# Patient Record
Sex: Female | Born: 1960 | Race: White | Hispanic: No | Smoking: Never smoker
Health system: Southern US, Community
[De-identification: ages and names within clinical notes are randomized; demographics above are authoritative.]

## PROBLEM LIST (undated history)

## (undated) DIAGNOSIS — E039 Hypothyroidism, unspecified: Secondary | ICD-10-CM

## (undated) DIAGNOSIS — N951 Menopausal and female climacteric states: Secondary | ICD-10-CM

## (undated) DIAGNOSIS — E669 Obesity, unspecified: Secondary | ICD-10-CM

---

## 1997-09-17 ENCOUNTER — Ambulatory Visit (HOSPITAL_COMMUNITY): Admission: RE | Admit: 1997-09-17 | Discharge: 1997-09-17 | Payer: Self-pay | Admitting: Obstetrics & Gynecology

## 1997-11-30 ENCOUNTER — Encounter: Admission: RE | Admit: 1997-11-30 | Discharge: 1998-02-28 | Payer: Self-pay | Admitting: *Deleted

## 1997-12-08 ENCOUNTER — Encounter: Payer: Self-pay | Admitting: Obstetrics and Gynecology

## 1997-12-08 ENCOUNTER — Inpatient Hospital Stay (HOSPITAL_COMMUNITY): Admission: AD | Admit: 1997-12-08 | Discharge: 1997-12-11 | Payer: Self-pay | Admitting: Obstetrics and Gynecology

## 1997-12-11 ENCOUNTER — Encounter (HOSPITAL_COMMUNITY): Admission: RE | Admit: 1997-12-11 | Discharge: 1998-03-11 | Payer: Self-pay | Admitting: Obstetrics and Gynecology

## 1998-03-14 ENCOUNTER — Encounter (HOSPITAL_COMMUNITY): Admission: RE | Admit: 1998-03-14 | Discharge: 1998-06-12 | Payer: Self-pay | Admitting: *Deleted

## 1999-05-30 ENCOUNTER — Ambulatory Visit (HOSPITAL_COMMUNITY): Admission: RE | Admit: 1999-05-30 | Discharge: 1999-05-30 | Payer: Self-pay | Admitting: *Deleted

## 1999-05-30 ENCOUNTER — Encounter: Payer: Self-pay | Admitting: *Deleted

## 2018-08-17 ENCOUNTER — Encounter (HOSPITAL_COMMUNITY): Payer: Self-pay

## 2018-08-17 ENCOUNTER — Emergency Department (HOSPITAL_COMMUNITY): Payer: PRIVATE HEALTH INSURANCE

## 2018-08-17 ENCOUNTER — Inpatient Hospital Stay (HOSPITAL_COMMUNITY)
Admission: EM | Admit: 2018-08-17 | Discharge: 2018-08-21 | DRG: 811 | Disposition: A | Discharge status: Home / Self Care | Payer: PRIVATE HEALTH INSURANCE | Attending: Internal Medicine | Admitting: Internal Medicine

## 2018-08-17 ENCOUNTER — Other Ambulatory Visit: Payer: Self-pay

## 2018-08-17 DIAGNOSIS — K5900 Constipation, unspecified: Secondary | ICD-10-CM | POA: Diagnosis present

## 2018-08-17 DIAGNOSIS — R0602 Shortness of breath: Secondary | ICD-10-CM | POA: Diagnosis not present

## 2018-08-17 DIAGNOSIS — I272 Pulmonary hypertension, unspecified: Secondary | ICD-10-CM | POA: Diagnosis present

## 2018-08-17 DIAGNOSIS — E669 Obesity, unspecified: Secondary | ICD-10-CM | POA: Diagnosis present

## 2018-08-17 DIAGNOSIS — D62 Acute posthemorrhagic anemia: Principal | ICD-10-CM | POA: Diagnosis present

## 2018-08-17 DIAGNOSIS — E282 Polycystic ovarian syndrome: Secondary | ICD-10-CM | POA: Diagnosis present

## 2018-08-17 DIAGNOSIS — Z79890 Hormone replacement therapy (postmenopausal): Secondary | ICD-10-CM

## 2018-08-17 DIAGNOSIS — E8770 Fluid overload, unspecified: Secondary | ICD-10-CM | POA: Diagnosis present

## 2018-08-17 DIAGNOSIS — T502X5A Adverse effect of carbonic-anhydrase inhibitors, benzothiadiazides and other diuretics, initial encounter: Secondary | ICD-10-CM | POA: Diagnosis not present

## 2018-08-17 DIAGNOSIS — Z87891 Personal history of nicotine dependence: Secondary | ICD-10-CM

## 2018-08-17 DIAGNOSIS — Z6835 Body mass index (BMI) 35.0-35.9, adult: Secondary | ICD-10-CM

## 2018-08-17 DIAGNOSIS — D259 Leiomyoma of uterus, unspecified: Secondary | ICD-10-CM | POA: Diagnosis present

## 2018-08-17 DIAGNOSIS — I5031 Acute diastolic (congestive) heart failure: Secondary | ICD-10-CM

## 2018-08-17 DIAGNOSIS — N920 Excessive and frequent menstruation with regular cycle: Secondary | ICD-10-CM

## 2018-08-17 DIAGNOSIS — E039 Hypothyroidism, unspecified: Secondary | ICD-10-CM | POA: Diagnosis present

## 2018-08-17 DIAGNOSIS — Z20828 Contact with and (suspected) exposure to other viral communicable diseases: Secondary | ICD-10-CM | POA: Diagnosis present

## 2018-08-17 DIAGNOSIS — D649 Anemia, unspecified: Secondary | ICD-10-CM | POA: Diagnosis present

## 2018-08-17 DIAGNOSIS — I313 Pericardial effusion (noninflammatory): Secondary | ICD-10-CM | POA: Diagnosis present

## 2018-08-17 DIAGNOSIS — E876 Hypokalemia: Secondary | ICD-10-CM | POA: Diagnosis present

## 2018-08-17 DIAGNOSIS — R7989 Other specified abnormal findings of blood chemistry: Secondary | ICD-10-CM | POA: Diagnosis present

## 2018-08-17 DIAGNOSIS — I509 Heart failure, unspecified: Secondary | ICD-10-CM

## 2018-08-17 DIAGNOSIS — E611 Iron deficiency: Secondary | ICD-10-CM | POA: Diagnosis present

## 2018-08-17 DIAGNOSIS — Z8249 Family history of ischemic heart disease and other diseases of the circulatory system: Secondary | ICD-10-CM

## 2018-08-17 HISTORY — DX: Obesity, unspecified: E66.9

## 2018-08-17 HISTORY — DX: Hypothyroidism, unspecified: E03.9

## 2018-08-17 HISTORY — DX: Menopausal and female climacteric states: N95.1

## 2018-08-17 LAB — BASIC METABOLIC PANEL
Anion gap: 10 (ref 5–15)
BUN: 10 mg/dL (ref 6–20)
CO2: 17 mmol/L — ABNORMAL LOW (ref 22–32)
Calcium: 8.6 mg/dL — ABNORMAL LOW (ref 8.9–10.3)
Chloride: 106 mmol/L (ref 98–111)
Creatinine, Ser: 0.76 mg/dL (ref 0.44–1.00)
GFR calc Af Amer: >60 mL/min (ref >60)
GFR calc non Af Amer: >60 mL/min (ref >60)
Glucose, Bld: 107 mg/dL — ABNORMAL HIGH (ref 70–99)
Potassium: 4.1 mmol/L (ref 3.5–5.1)
Sodium: 133 mmol/L — ABNORMAL LOW (ref 135–145)

## 2018-08-17 LAB — BRAIN NATRIURETIC PEPTIDE: B Natriuretic Peptide: 451 pg/mL — ABNORMAL HIGH (ref 0.0–100.0)

## 2018-08-17 LAB — TROPONIN I: Troponin I: <0.03 ng/mL (ref <0.03)

## 2018-08-17 LAB — D-DIMER, QUANTITATIVE: D-Dimer, Quant: 2.31 ug/mL-FEU — ABNORMAL HIGH (ref 0.00–0.50)

## 2018-08-17 MED ORDER — SODIUM CHLORIDE 0.9% FLUSH
3.0000 mL | Freq: Once | INTRAVENOUS | Status: AC
  Administered 2018-08-18: 3 mL via INTRAVENOUS

## 2018-08-17 MED ORDER — SODIUM CHLORIDE (PF) 0.9 % IJ SOLN
INTRAMUSCULAR | Status: AC
  Administered 2018-08-18: 3 mL via INTRAVENOUS
  Filled 2018-08-17: qty 50

## 2018-08-17 MED ORDER — IOHEXOL 350 MG/ML SOLN
100.0000 mL | Freq: Once | INTRAVENOUS | Status: AC | PRN
  Administered 2018-08-17: 100 mL via INTRAVENOUS

## 2018-08-17 NOTE — ED Triage Notes (Signed)
Pt states she is perimenopausal and has been weak from that. Pt spent a lot of time in bed from that. Pt states that she has continued to be short of breath, increasing with exertion. Pt has noticed some leg edema and abdomen as well. +1 noted up legs. No hx of CHF. Pt has not been outside of house since February.

## 2018-08-17 NOTE — ED Provider Notes (Signed)
[facility] DEPT Provider Note   CSN: [id number] Arrival date & time: 08/17/18  1555    History   Chief Complaint Chief Complaint  Patient presents with  . Shortness of Breath    HPI Gwendolyn Mclean is a 58 y.o. female to emergency department today with chief complaint of shortness of breath x1 week.  Patient states 4 months ago she had heavy vaginal bleeding and thought she was perimenopausal.  During that time she spent most of her time laying in bed.  She states 2 months ago the bleeding improved and she had started to become a little more active. She has not had any vaginal bleeding in 1 month.  Over the last week she has noticed increasing shortness of breath with exertion.  She is unable to walk up the stairs in her house without having to stop and take a break, she estimates about 5 or 6 steps.  She has also noticed swelling in her bilateral lower extremities.  Admits to associated productive cough with thick clear sputum.  Denies history of CHF. Also denies fever, chills, chest pain, abdominal pain, nausea, vomiting, urinary symptoms, diarrhea.    History reviewed. No pertinent past medical history.  Patient Active Problem List   Diagnosis Date Noted  . Symptomatic anemia 08/18/2018  . Hypothyroidism 08/18/2018    History reviewed. No pertinent surgical history.   OB History   No obstetric history on file.      Home Medications    Prior to Admission medications   Medication Sig Start Date End Date Taking? Authorizing Provider  levothyroxine (SYNTHROID) 100 MCG tablet Take 100 mcg by mouth daily before breakfast.   Yes [provider]    Family History Family History  Problem Relation Age of Onset  . Hypertension Mother   . CAD Mother   . Diabetes Neg Hx   . Cancer Neg Hx     Social History Social History   Tobacco Use  . Smoking status: Never Smoker  . Smokeless tobacco: Never Used  Substance Use Topics  . Alcohol  use: Never    Frequency: Never  . Drug use: Never     Allergies   Patient has no known allergies.   Review of Systems Review of Systems  Constitutional: Negative for chills and fever.  HENT: Negative for congestion, ear discharge, ear pain, sinus pressure, sinus pain and sore throat.   Eyes: Negative for pain and redness.  Respiratory: Positive for cough and shortness of breath.   Cardiovascular: Positive for leg swelling. Negative for chest pain.  Gastrointestinal: Negative for abdominal pain, constipation, diarrhea, nausea and vomiting.  Genitourinary: Negative for dysuria and hematuria.  Musculoskeletal: Negative for back pain and neck pain.  Skin: Negative for wound.  Neurological: Negative for weakness, numbness and headaches.     Physical Exam Updated Vital Signs BP (!) 113/50 (BP Location: Left Arm)   Pulse 99   Temp 98.5 F (36.9 C) (Oral)   Resp (!) 34   Ht 5\' 3"  (1.6 m)   Wt 77.1 kg   SpO2 100%   BMI 30.11 kg/m   Physical Exam Vitals signs and nursing note reviewed.  Constitutional:      General: She is not in acute distress.    Appearance: She is not ill-appearing.  HENT:     Head: Normocephalic and atraumatic.     Right Ear: Tympanic membrane and external ear normal.     Left Ear: Tympanic membrane and external  ear normal.     Nose: Nose normal.     Mouth/Throat:     Mouth: Mucous membranes are dry.     Pharynx: Oropharynx is clear.  Eyes:     General: No scleral icterus.       Right eye: No discharge.        Left eye: No discharge.     Extraocular Movements: Extraocular movements intact.     Conjunctiva/sclera: Conjunctivae normal.     Pupils: Pupils are equal, round, and reactive to light.     Comments: Bilateral conjunctiva pale  Neck:     Musculoskeletal: Normal range of motion.     Vascular: No JVD.  Cardiovascular:     Rate and Rhythm: Regular rhythm. Tachycardia present.     Pulses: Normal pulses.          Radial pulses are 2+ on the  right side and 2+ on the left side.     Heart sounds: Normal heart sounds.  Pulmonary:     Comments: Lungs clear to auscultation in all fields. Symmetric chest rise. No wheezing, rales, or rhonchi. Abdominal:     Comments: Abdomen is soft, non-distended, and non-tender in all quadrants. No rigidity, no guarding. No peritoneal signs.  Musculoskeletal: Normal range of motion.     Right lower leg: 2+ Pitting Edema present.     Left lower leg: 2+ Pitting Edema present.  Skin:    General: Skin is warm and dry.     Capillary Refill: Capillary refill takes less than 2 seconds.  Neurological:     Mental Status: She is oriented to person, place, and time.     GCS: GCS eye subscore is 4. GCS verbal subscore is 5. GCS motor subscore is 6.     Comments: Fluent speech, no facial droop.  Psychiatric:        Behavior: Behavior normal.      ED Treatments / Results  Labs (all labs ordered are listed, but only abnormal results are displayed) Labs Reviewed  BASIC METABOLIC PANEL - Abnormal; Notable for the following components:      Result Value   Sodium 133 (*)    CO2 17 (*)    Glucose, Bld 107 (*)    Calcium 8.6 (*)    All other components within normal limits  D-DIMER, QUANTITATIVE (NOT AT [facility]) - Abnormal; Notable for the following components:   D-Dimer, Quant 2.31 (*)    All other components within normal limits  CBC - Abnormal; Notable for the following components:   RBC 1.44 (*)    Hemoglobin 2.0 (*)    HCT 9.2 (*)    MCV 63.9 (*)    MCH 13.9 (*)    MCHC 21.7 (*)    RDW 21.6 (*)    nRBC 0.4 (*)    All other components within normal limits  BRAIN NATRIURETIC PEPTIDE - Abnormal; Notable for the following components:   B Natriuretic Peptide 451.0 (*)    All other components within normal limits  RETICULOCYTES - Abnormal; Notable for the following components:   RBC. 1.35 (*)    Immature Retic Fract 23.1 (*)    All other components within normal limits  SARS CORONAVIRUS 2  (HOSPITAL ORDER, [facility])  TROPONIN I  URINALYSIS, ROUTINE W REFLEX MICROSCOPIC  CBC WITH DIFFERENTIAL/PLATELET  VITAMIN B12  FOLATE  IRON AND TIBC  FERRITIN  MAGNESIUM  TSH  PHOSPHORUS  CBC WITH DIFFERENTIAL/PLATELET  HEPATIC FUNCTION  PANEL  TROPONIN I  TROPONIN I  TROPONIN I  LACTIC ACID, PLASMA  PROTIME-INR  I-STAT BETA HCG BLOOD, ED ([facility], [facility], [facility] ONLY)  TYPE AND SCREEN  ABO/RH  PREPARE RBC (CROSSMATCH)    EKG EKG Interpretation  Date/Time:  Wednesday August 17 2018 16:14:55 EDT Ventricular Rate:  102 PR Interval:    QRS Duration: 100 QT Interval:  343 QTC Calculation: 447 R Axis:   93 Text Interpretation:  Sinus tachycardia Borderline right axis deviation Low voltage, precordial leads No previous ECGs available Confirmed by Molpus, John [id number]) on 08/18/2018 1:16:30 AM   Radiology Ct Angio Chest Pe W And/or Wo Contrast  Result Date: 08/17/2018 CLINICAL DATA:  Productive cough. Shortness of breath times several months. EXAM: CT ANGIOGRAPHY CHEST WITH CONTRAST TECHNIQUE: Multidetector CT imaging of the chest was performed using the standard protocol during bolus administration of intravenous contrast. Multiplanar CT image reconstructions and MIPs were obtained to evaluate the vascular anatomy. CONTRAST:  134mL OMNIPAQUE IOHEXOL 350 MG/ML SOLN COMPARISON:  None. FINDINGS: Cardiovascular: The heart is enlarged. There is a small to moderate-sized pericardial effusion. The main pulmonary artery is dilated measuring up to approximately 3.6 cm in diameter. Detection of pulmonary emboli is limited by motion artifact and suboptimal contrast bolus timing. Given this limitation, there is no definite PE. Detection of small segmental and subsegmental pulmonary emboli is not possible on this exam. Mediastinum/Nodes: No enlarged mediastinal, hilar, or axillary lymph nodes. Thyroid gland, trachea, and esophagus demonstrate no significant findings. Lungs/Pleura:  There is a moderate-sized right-sided pleural effusion. There is a small left-sided pleural effusion. There is some interlobular septal thickening bilaterally consistent with volume overload. There is atelectasis at the lung bases, right worse than left. Detection of small pulmonary nodules is limited by significant motion artifact. Upper Abdomen: No acute abnormality. Musculoskeletal: No chest wall abnormality. No acute or significant osseous findings. Review of the MIP images confirms the above findings. IMPRESSION: 1. Limited study secondary to contrast bolus timing and motion artifact. Given these limitations, no PE was identified. 2. Small to moderate-sized pericardial effusion. There is cardiomegaly with findings of volume overload. 3. Moderate right and small left pleural effusions. 4. Bibasilar atelectasis. 5. Dilated main pulmonary artery which can be seen in patients with elevated pulmonary artery pressures. Electronically Signed   By: Constance Holster M.D.   On: 08/17/2018 20:50   Dg Chest Port 1 View  Result Date: 08/17/2018 CLINICAL DATA:  Shortness of breath EXAM: PORTABLE CHEST 1 VIEW COMPARISON:  None. FINDINGS: Small right pleural effusion. No left pleural effusion. No pneumothorax. No focal consolidation. Normal cardiomediastinal silhouette. No aggressive osseous lesion. IMPRESSION: 1. Small right pleural effusion. Electronically Signed   By: Kathreen Devoid   On: 08/17/2018 18:53    Procedures .Critical Care Performed by: Cherre Robins, PA-C Authorized by: Cherre Robins, PA-C   Critical care provider statement:    Critical care time (minutes):  40   Critical care time was exclusive of:  Separately billable procedures and treating other patients and teaching time   Critical care was time spent personally by me on the following activities:  Discussions with consultants, development of treatment plan with patient or surrogate, evaluation of patient's response to treatment,  examination of patient, pulse oximetry, ordering and review of radiographic studies, ordering and review of laboratory studies and ordering and performing treatments and interventions   (including critical care time)  Medications Ordered in ED Medications  sodium chloride flush (NS) 0.9 % injection  3 mL (0 mLs Intravenous Hold 08/17/18 1735)  0.9 %  sodium chloride infusion (has no administration in time range)  0.9 %  sodium chloride infusion (Manually program via Guardrails IV Fluids) (has no administration in time range)  furosemide (LASIX) injection 20 mg (has no administration in time range)  iohexol (OMNIPAQUE) 350 MG/ML injection 100 mL (100 mLs Intravenous Contrast Given 08/17/18 2030)     Initial Impression / Assessment and Plan / ED Course  I have reviewed the triage vital signs and the nursing notes.  Pertinent labs & imaging results that were available during my care of the patient were reviewed by me and considered in my medical decision making (see chart for details).   58 year old female presents with shortness of breath.  On arrival she is afebrile.  She is tachycardic and tachypneic.  Bilateral 2+ pitting lower extremity edema. Conjunctiva pale. Lung sounds clear throughout.  EKG without ischemic changes.Troponin negative. D-dimer elevated to 2.31. BMP unremarkable. Chest xray viewed by me shows right sided pleural effusion. CTA chest negative for PE. Radiologist reports show cardiomegaly with findings of volume overload and small to moderate-sized pericardial effusion. BNP is elevated . Lab informed hemoglobin of 2.0. Rechecked and hemoglobin is 2.0. Type and screen added. Will transfuse 3 units. Anemia panel pending. This case was discussed with Dr. Florina Ou  who has seen the patient and agrees with plan to admit. Spoke with Dr. Roel Cluck with hospitalist service who agrees to assume care of patient and bring into the hospital for further evaluation and management.    This note  was prepared using Dragon voice recognition software and may include unintentional dictation errors due to the inherent limitations of voice recognition software.   Final Clinical Impressions(s) / ED Diagnoses   Final diagnoses:  Symptomatic anemia    ED Discharge Orders    None       Flint Melter 08/18/18 0119    Molpus, Jenny Reichmann, MD 08/18/18 [phone]

## 2018-08-18 ENCOUNTER — Observation Stay (HOSPITAL_COMMUNITY): Payer: PRIVATE HEALTH INSURANCE

## 2018-08-18 ENCOUNTER — Encounter (HOSPITAL_COMMUNITY): Payer: Self-pay | Admitting: Internal Medicine

## 2018-08-18 DIAGNOSIS — R7989 Other specified abnormal findings of blood chemistry: Secondary | ICD-10-CM | POA: Diagnosis not present

## 2018-08-18 DIAGNOSIS — E8770 Fluid overload, unspecified: Secondary | ICD-10-CM | POA: Diagnosis not present

## 2018-08-18 DIAGNOSIS — I5031 Acute diastolic (congestive) heart failure: Secondary | ICD-10-CM

## 2018-08-18 DIAGNOSIS — D500 Iron deficiency anemia secondary to blood loss (chronic): Secondary | ICD-10-CM | POA: Diagnosis not present

## 2018-08-18 DIAGNOSIS — E876 Hypokalemia: Secondary | ICD-10-CM | POA: Diagnosis present

## 2018-08-18 DIAGNOSIS — E611 Iron deficiency: Secondary | ICD-10-CM | POA: Diagnosis present

## 2018-08-18 DIAGNOSIS — Z79890 Hormone replacement therapy (postmenopausal): Secondary | ICD-10-CM | POA: Diagnosis not present

## 2018-08-18 DIAGNOSIS — Z6835 Body mass index (BMI) 35.0-35.9, adult: Secondary | ICD-10-CM | POA: Diagnosis not present

## 2018-08-18 DIAGNOSIS — I509 Heart failure, unspecified: Secondary | ICD-10-CM

## 2018-08-18 DIAGNOSIS — D259 Leiomyoma of uterus, unspecified: Secondary | ICD-10-CM | POA: Diagnosis present

## 2018-08-18 DIAGNOSIS — E669 Obesity, unspecified: Secondary | ICD-10-CM | POA: Diagnosis present

## 2018-08-18 DIAGNOSIS — D649 Anemia, unspecified: Secondary | ICD-10-CM | POA: Diagnosis present

## 2018-08-18 DIAGNOSIS — Z87891 Personal history of nicotine dependence: Secondary | ICD-10-CM | POA: Diagnosis not present

## 2018-08-18 DIAGNOSIS — E039 Hypothyroidism, unspecified: Secondary | ICD-10-CM | POA: Diagnosis present

## 2018-08-18 DIAGNOSIS — E034 Atrophy of thyroid (acquired): Secondary | ICD-10-CM | POA: Diagnosis not present

## 2018-08-18 DIAGNOSIS — K5900 Constipation, unspecified: Secondary | ICD-10-CM | POA: Diagnosis present

## 2018-08-18 DIAGNOSIS — I313 Pericardial effusion (noninflammatory): Secondary | ICD-10-CM | POA: Diagnosis present

## 2018-08-18 DIAGNOSIS — R0602 Shortness of breath: Secondary | ICD-10-CM

## 2018-08-18 DIAGNOSIS — I272 Pulmonary hypertension, unspecified: Secondary | ICD-10-CM | POA: Diagnosis present

## 2018-08-18 DIAGNOSIS — N920 Excessive and frequent menstruation with regular cycle: Secondary | ICD-10-CM | POA: Diagnosis present

## 2018-08-18 DIAGNOSIS — Z20828 Contact with and (suspected) exposure to other viral communicable diseases: Secondary | ICD-10-CM | POA: Diagnosis present

## 2018-08-18 DIAGNOSIS — E282 Polycystic ovarian syndrome: Secondary | ICD-10-CM | POA: Diagnosis present

## 2018-08-18 DIAGNOSIS — T502X5A Adverse effect of carbonic-anhydrase inhibitors, benzothiadiazides and other diuretics, initial encounter: Secondary | ICD-10-CM | POA: Diagnosis not present

## 2018-08-18 DIAGNOSIS — Z8249 Family history of ischemic heart disease and other diseases of the circulatory system: Secondary | ICD-10-CM | POA: Diagnosis not present

## 2018-08-18 DIAGNOSIS — D62 Acute posthemorrhagic anemia: Secondary | ICD-10-CM | POA: Diagnosis present

## 2018-08-18 LAB — CBC WITH DIFFERENTIAL/PLATELET
Abs Immature Granulocytes: 0.09 10*3/uL — ABNORMAL HIGH (ref 0.00–0.07)
Basophils Absolute: 0 10*3/uL (ref 0.0–0.1)
Basophils Relative: 1 %
Eosinophils Absolute: 0.1 10*3/uL (ref 0.0–0.5)
Eosinophils Relative: 2 %
HCT: 19 % — ABNORMAL LOW (ref 36.0–46.0)
Hemoglobin: 5.7 g/dL — CL (ref 12.0–15.0)
Immature Granulocytes: 2 %
Lymphocytes Relative: 12 %
Lymphs Abs: 0.7 10*3/uL (ref 0.7–4.0)
MCH: 22.2 pg — ABNORMAL LOW (ref 26.0–34.0)
MCHC: 30 g/dL (ref 30.0–36.0)
MCV: 73.9 fL — ABNORMAL LOW (ref 80.0–100.0)
Monocytes Absolute: 0.4 10*3/uL (ref 0.1–1.0)
Monocytes Relative: 6 %
Neutro Abs: 4.3 10*3/uL (ref 1.7–7.7)
Neutrophils Relative %: 77 %
Platelets: 216 10*3/uL (ref 150–400)
RBC: 2.57 MIL/uL — ABNORMAL LOW (ref 3.87–5.11)
RDW: 25.2 % — ABNORMAL HIGH (ref 11.5–15.5)
WBC: 5.6 10*3/uL (ref 4.0–10.5)
nRBC: 0.9 % — ABNORMAL HIGH (ref 0.0–0.2)

## 2018-08-18 LAB — ECHOCARDIOGRAM COMPLETE
Height: 63 in
Weight: 3400.38 oz

## 2018-08-18 LAB — PROTIME-INR
INR: 1.2 (ref 0.8–1.2)
Prothrombin Time: 15.5 seconds — ABNORMAL HIGH (ref 11.4–15.2)

## 2018-08-18 LAB — LACTIC ACID, PLASMA
Lactic Acid, Venous: 2.9 mmol/L (ref 0.5–1.9)
Lactic Acid, Venous: 1.7 mmol/L (ref 0.5–1.9)

## 2018-08-18 LAB — COMPREHENSIVE METABOLIC PANEL
ALT: 22 U/L (ref 0–44)
AST: 26 U/L (ref 15–41)
Albumin: 3.8 g/dL (ref 3.5–5.0)
Alkaline Phosphatase: 44 U/L (ref 38–126)
Anion gap: 10 (ref 5–15)
BUN: 10 mg/dL (ref 6–20)
CO2: 21 mmol/L — ABNORMAL LOW (ref 22–32)
Calcium: 8.2 mg/dL — ABNORMAL LOW (ref 8.9–10.3)
Chloride: 105 mmol/L (ref 98–111)
Creatinine, Ser: 0.65 mg/dL (ref 0.44–1.00)
GFR calc Af Amer: >60 mL/min (ref >60)
GFR calc non Af Amer: >60 mL/min (ref >60)
Glucose, Bld: 125 mg/dL — ABNORMAL HIGH (ref 70–99)
Potassium: 3.2 mmol/L — ABNORMAL LOW (ref 3.5–5.1)
Sodium: 136 mmol/L (ref 135–145)
Total Bilirubin: 3.7 mg/dL — ABNORMAL HIGH (ref 0.3–1.2)
Total Protein: 6.7 g/dL (ref 6.5–8.1)

## 2018-08-18 LAB — IRON AND TIBC
Iron: 8 ug/dL — ABNORMAL LOW (ref 28–170)
Saturation Ratios: 2 % — ABNORMAL LOW (ref 10.4–31.8)
TIBC: 524 ug/dL — ABNORMAL HIGH (ref 250–450)
UIBC: 516 ug/dL

## 2018-08-18 LAB — ABO/RH: ABO/RH(D): A POS

## 2018-08-18 LAB — PHOSPHORUS: Phosphorus: 2.3 mg/dL — ABNORMAL LOW (ref 2.5–4.6)

## 2018-08-18 LAB — RETICULOCYTES
Immature Retic Fract: 23.1 % — ABNORMAL HIGH (ref 2.3–15.9)
RBC.: 1.35 MIL/uL — ABNORMAL LOW (ref 3.87–5.11)
Retic Count, Absolute: 34.2 10*3/uL (ref 19.0–186.0)
Retic Ct Pct: 2.5 % (ref 0.4–3.1)

## 2018-08-18 LAB — TSH: TSH: 5.026 u[IU]/mL — ABNORMAL HIGH (ref 0.350–4.500)

## 2018-08-18 LAB — MAGNESIUM: Magnesium: 2.3 mg/dL (ref 1.7–2.4)

## 2018-08-18 LAB — PREPARE RBC (CROSSMATCH)

## 2018-08-18 LAB — SARS CORONAVIRUS 2 BY RT PCR (HOSPITAL ORDER, PERFORMED IN ~~LOC~~ HOSPITAL LAB): SARS Coronavirus 2: NEGATIVE

## 2018-08-18 LAB — FOLATE: Folate: 16.6 ng/mL (ref >5.9)

## 2018-08-18 LAB — CBC
HCT: 9.2 % — ABNORMAL LOW (ref 36.0–46.0)
Hemoglobin: 2 g/dL — CL (ref 12.0–15.0)
MCH: 13.9 pg — ABNORMAL LOW (ref 26.0–34.0)
MCHC: 21.7 g/dL — ABNORMAL LOW (ref 30.0–36.0)
MCV: 63.9 fL — ABNORMAL LOW (ref 80.0–100.0)
Platelets: 218 10*3/uL (ref 150–400)
RBC: 1.44 MIL/uL — ABNORMAL LOW (ref 3.87–5.11)
RDW: 21.6 % — ABNORMAL HIGH (ref 11.5–15.5)
WBC: 4.7 10*3/uL (ref 4.0–10.5)
nRBC: 0.4 % — ABNORMAL HIGH (ref 0.0–0.2)

## 2018-08-18 LAB — HEMOGLOBIN AND HEMATOCRIT, BLOOD
HCT: 23.2 % — ABNORMAL LOW (ref 36.0–46.0)
Hemoglobin: 7.1 g/dL — ABNORMAL LOW (ref 12.0–15.0)

## 2018-08-18 LAB — VITAMIN B12: Vitamin B-12: 454 pg/mL (ref 180–914)

## 2018-08-18 LAB — MRSA PCR SCREENING: MRSA by PCR: NEGATIVE

## 2018-08-18 LAB — T4, FREE: Free T4: 1.22 ng/dL — ABNORMAL HIGH (ref 0.61–1.12)

## 2018-08-18 LAB — FERRITIN: Ferritin: 1 ng/mL — ABNORMAL LOW (ref 11–307)

## 2018-08-18 MED ORDER — ACETAMINOPHEN 650 MG RE SUPP: 650.0000 mg | Freq: Four times a day (QID) | RECTAL | Status: DC | PRN

## 2018-08-18 MED ORDER — ONDANSETRON HCL 4 MG PO TABS: 4.0000 mg | ORAL_TABLET | Freq: Four times a day (QID) | ORAL | Status: DC | PRN

## 2018-08-18 MED ORDER — SODIUM CHLORIDE 0.9% IV SOLUTION
Freq: Once | INTRAVENOUS | Status: AC
  Administered 2018-08-18: 03:00:00 via INTRAVENOUS

## 2018-08-18 MED ORDER — ACETAMINOPHEN 325 MG PO TABS: 650.0000 mg | ORAL_TABLET | Freq: Four times a day (QID) | ORAL | Status: DC | PRN

## 2018-08-18 MED ORDER — HYDROCODONE-ACETAMINOPHEN 5-325 MG PO TABS: 1.0000 | ORAL_TABLET | ORAL | Status: DC | PRN

## 2018-08-18 MED ORDER — CHLORHEXIDINE GLUCONATE CLOTH 2 % EX PADS
6.0000 | MEDICATED_PAD | Freq: Every day | CUTANEOUS | Status: DC
  Administered 2018-08-19: 6 via TOPICAL

## 2018-08-18 MED ORDER — FUROSEMIDE 10 MG/ML IJ SOLN
20.0000 mg | Freq: Once | INTRAMUSCULAR | Status: AC
  Administered 2018-08-18: 20 mg via INTRAVENOUS
  Filled 2018-08-18: qty 2

## 2018-08-18 MED ORDER — SODIUM CHLORIDE 0.9% IV SOLUTION
Freq: Once | INTRAVENOUS | Status: AC
  Administered 2018-08-18: 15:00:00 via INTRAVENOUS

## 2018-08-18 MED ORDER — SODIUM CHLORIDE 0.9 % IV SOLN: 250.0000 mL | INTRAVENOUS | Status: DC | PRN

## 2018-08-18 MED ORDER — LEVOTHYROXINE SODIUM 100 MCG PO TABS
100.0000 ug | ORAL_TABLET | Freq: Every day | ORAL | Status: DC
  Administered 2018-08-18 – 2018-08-21 (×4): 100 ug via ORAL
  Filled 2018-08-18 (×4): qty 1

## 2018-08-18 MED ORDER — SODIUM CHLORIDE 0.9 % IV SOLN: 10.0000 mL/h | Freq: Once | INTRAVENOUS | Status: DC

## 2018-08-18 MED ORDER — LIVING BETTER WITH HEART FAILURE BOOK
Freq: Once | Status: DC
  Filled 2018-08-18: qty 1

## 2018-08-18 MED ORDER — SODIUM CHLORIDE 0.9% FLUSH
3.0000 mL | Freq: Two times a day (BID) | INTRAVENOUS | Status: DC
  Administered 2018-08-18 – 2018-08-19 (×5): 3 mL via INTRAVENOUS

## 2018-08-18 MED ORDER — ONDANSETRON HCL 4 MG/2ML IJ SOLN: 4.0000 mg | Freq: Four times a day (QID) | INTRAMUSCULAR | Status: DC | PRN

## 2018-08-18 MED ORDER — FUROSEMIDE 10 MG/ML IJ SOLN
40.0000 mg | Freq: Two times a day (BID) | INTRAMUSCULAR | Status: DC
  Administered 2018-08-18 – 2018-08-19 (×4): 40 mg via INTRAVENOUS
  Filled 2018-08-18 (×4): qty 4

## 2018-08-18 MED ORDER — SODIUM CHLORIDE 0.9% FLUSH: 3.0000 mL | INTRAVENOUS | Status: DC | PRN

## 2018-08-18 MED ORDER — ORAL CARE MOUTH RINSE
15.0000 mL | Freq: Two times a day (BID) | OROMUCOSAL | Status: DC
  Administered 2018-08-18 – 2018-08-21 (×7): 15 mL via OROMUCOSAL

## 2018-08-18 NOTE — Consult Note (Addendum)
Cardiology Consultation:   Patient ID: Gwendolyn Mclean; [id number]; Jan 04, 1961   Admit date: 08/17/2018 Date of Consult: 08/18/2018  Primary Care Provider: Patient, No Pcp Per Primary Cardiologist: Fransico Him, MD (new) Primary Electrophysiologist:  None  Chief Complaint: leg swelling, shortness of breath (admitted with hemoglobin of 2)  Patient Profile:   Gwendolyn Mclean is a 58 y.o. female with a hx of hypothyroidism, obesity and perimenopausal state who is being seen today for the evaluation of possible CHF and pericardial effusion at the request of Dr. Roel Cluck.  History of Present Illness:   [name] has no significant cardiac history or family history of blood dyscrasias. About 4 months ago she had very severe vaginal bleeding on/off for the weeks but did not seek care at that time. Her last episode of vaginal bleeding was in late April/early May per her report. During that time she felt so debilitated she laid around in bed quite a bit when she was on her cycle. Her appetite was very poor so she ate about 1 meal a day and did not leave her house. Around 4 months ago she also began to notice dyspnea on exertion. No CP or palpitations. 1-2 weeks ago she noticed mild lower extremity swelling she hoped would go away on its own. Her sister came in town to visit a few days ago urged her to seek care. She was found to have profound microcytic anemia with hemoglobin of 2.0 and hematocrit of 9.2 with ferritin of 1. B12 is normal. BNP was 451 and troponin was negative. As part of her workup she underwent CTA which was limited due to timing but no overt PE, + small to moderate pericardial effusion with cardiomegaly and findings of volume overload, moderate right and small left pleural effusion, dilated main PA which can be seen with elevated PA pressures. Rapid covid testing was negative.She received 20mg  IV Lasix with significant urine output per pt (was incontinent over bed due to volume of urine per her  report), and improvement in edema. She is receiving her 3rd unit of blood. Repeat CBC not yet rechecked - nurse checked with lab and they will be coming to draw after unit of blood.  The patient was only mildly tachycardic on arrival but with normal blood pressure despite such severe anemia. She is alert, awake, and feeling OK at rest. She adamantly denies any other sources of bleeding including hematemesis, hematuria, melena. She is not vegetarian but has had poor appetite the last few months. Denies any major changes in weight recently.   Past Medical History:  Diagnosis Date   Hypothyroidism    Obesity    Perimenopausal     History reviewed. No pertinent surgical history.   Inpatient Medications: Scheduled Meds:  Chlorhexidine Gluconate Cloth  6 each Topical Daily   levothyroxine  100 mcg Oral QAC breakfast   Living Better with Heart Failure Book   Does not apply Once   mouth rinse  15 mL Mouth Rinse BID   sodium chloride flush  3 mL Intravenous Q12H   Continuous Infusions:  sodium chloride Stopped (08/18/18 0309)   sodium chloride     PRN Meds: sodium chloride, acetaminophen **OR** acetaminophen, HYDROcodone-acetaminophen, ondansetron **OR** ondansetron (ZOFRAN) IV, sodium chloride flush  Home Meds: Prior to Admission medications   Medication Sig Start Date End Date Taking? Authorizing Provider  levothyroxine (SYNTHROID) 100 MCG tablet Take 100 mcg by mouth daily before breakfast.   Yes [provider]    Allergies:  No Known Allergies  Social History:   Social History   Socioeconomic History   Marital status: Married    Spouse name: Not on file   Number of children: Not on file   Years of education: Not on file   Highest education level: Not on file  Occupational History   Not on file  Social Needs   Financial resource strain: Not on file   Food insecurity    Worry: Not on file    Inability: Not on file   Transportation needs     Medical: Not on file    Non-medical: Not on file  Tobacco Use   Smoking status: Never Smoker   Smokeless tobacco: Never Used  Substance and Sexual Activity   Alcohol use: Never    Frequency: Never   Drug use: Never   Sexual activity: Not on file  Lifestyle   Physical activity    Days per week: Not on file    Minutes per session: Not on file   Stress: Not on file  Relationships   Social connections    Talks on phone: Not on file    Gets together: Not on file    Attends religious service: Not on file    Active member of club or organization: Not on file    Attends meetings of clubs or organizations: Not on file    Relationship status: Not on file   Intimate partner violence    Fear of current or ex partner: Not on file    Emotionally abused: Not on file    Physically abused: Not on file    Forced sexual activity: Not on file  Other Topics Concern   Not on file  Social History Narrative   Not on file    Family History:   The patient's family history includes CAD in her mother; Hypertension in her mother. There is no history of Diabetes or Cancer.  ROS:  Please see the history of present illness.  All other ROS reviewed and negative.     Physical Exam/Data:   Vitals:   08/18/18 0730 08/18/18 0800 08/18/18 0836 08/18/18 0852  BP:  131/68 124/67 126/66  Pulse:  83 84 88  Resp:  18 (!) 24 (!) 31  Temp: 98.2 F (36.8 C)  98 F (36.7 C) 98.2 F (36.8 C)  TempSrc: Oral  Oral Oral  SpO2:  99% 97% 99%  Weight:      Height:        Intake/Output Summary (Last 24 hours) at 08/18/2018 0902 Last data filed at 08/18/2018 0715 Gross per 24 hour  Intake 1145 ml  Output --  Net 1145 ml   Last 3 Weights 08/18/2018 08/17/2018  Weight (lbs) 212 lb 8.4 oz 170 lb  Weight (kg) 96.4 kg 77.111 kg    Body mass index is 37.65 kg/m.  General: Well developed, well nourished obese F in no acute distress. Head: Normocephalic, atraumatic, sclera non-icteric, no xanthomas,  nares are without discharge.  Neck: Negative for carotid bruits. JVD not elevated. Lungs: Decreased BS bilateral bases R>L. No wheezes, rales, rhonchi. Breathing is unlabored. Heart: RRR with S1 S2. No murmurs, rubs, or gallops appreciated. Abdomen: Soft, non-tender, non-distended with normoactive bowel sounds. No hepatomegaly. No rebound/guarding. No obvious abdominal masses. Msk:  Strength and tone appear normal for age. Extremities: No clubbing or cyanosis. Trace BLE edema superimposed on large baseline leg habitus (pt states much improved after Lasix). Distal pedal pulses are 2+ and equal bilaterally.  Neuro: Alert and oriented X 3. No facial asymmetry. No focal deficit. Moves all extremities spontaneously. Psych:  Responds to questions appropriately with a normal affect.  EKG:  The EKG was personally reviewed and demonstrates sinus tachycardia 102bpm, borderline RAD, generally low voltage, subtle downsloped ST-T changes inferiorly. No prior to compare to.  Laboratory Data:  Chemistry Recent Labs  Lab 08/17/18 1714  NA 133*  K 4.1  CL 106  CO2 17*  GLUCOSE 107*  BUN 10  CREATININE 0.76  CALCIUM 8.6*  GFRNONAA >60  GFRAA >60  ANIONGAP 10    No results for input(s): PROT, ALBUMIN, AST, ALT, ALKPHOS, BILITOT in the last 168 hours. Hematology Recent Labs  Lab 08/17/18 2252 08/18/18 0028  WBC 4.7  --   RBC 1.44* 1.35*  HGB 2.0*  --   HCT 9.2*  --   MCV 63.9*  --   MCH 13.9*  --   MCHC 21.7*  --   RDW 21.6*  --   PLT 218  --    Cardiac Enzymes Recent Labs  Lab 08/17/18 1812  TROPONINI <0.03   No results for input(s): TROPIPOC in the last 168 hours.  BNP Recent Labs  Lab 08/17/18 2252  BNP 451.0*    DDimer  Recent Labs  Lab 08/17/18 1812  DDIMER 2.31*    Radiology/Studies:  Ct Angio Chest Pe W And/or Wo Contrast  Result Date: 08/17/2018 CLINICAL DATA:  Productive cough. Shortness of breath times several months. EXAM: CT ANGIOGRAPHY CHEST WITH CONTRAST  TECHNIQUE: Multidetector CT imaging of the chest was performed using the standard protocol during bolus administration of intravenous contrast. Multiplanar CT image reconstructions and MIPs were obtained to evaluate the vascular anatomy. CONTRAST:  175mL OMNIPAQUE IOHEXOL 350 MG/ML SOLN COMPARISON:  None. FINDINGS: Cardiovascular: The heart is enlarged. There is a small to moderate-sized pericardial effusion. The main pulmonary artery is dilated measuring up to approximately 3.6 cm in diameter. Detection of pulmonary emboli is limited by motion artifact and suboptimal contrast bolus timing. Given this limitation, there is no definite PE. Detection of small segmental and subsegmental pulmonary emboli is not possible on this exam. Mediastinum/Nodes: No enlarged mediastinal, hilar, or axillary lymph nodes. Thyroid gland, trachea, and esophagus demonstrate no significant findings. Lungs/Pleura: There is a moderate-sized right-sided pleural effusion. There is a small left-sided pleural effusion. There is some interlobular septal thickening bilaterally consistent with volume overload. There is atelectasis at the lung bases, right worse than left. Detection of small pulmonary nodules is limited by significant motion artifact. Upper Abdomen: No acute abnormality. Musculoskeletal: No chest wall abnormality. No acute or significant osseous findings. Review of the MIP images confirms the above findings. IMPRESSION: 1. Limited study secondary to contrast bolus timing and motion artifact. Given these limitations, no PE was identified. 2. Small to moderate-sized pericardial effusion. There is cardiomegaly with findings of volume overload. 3. Moderate right and small left pleural effusions. 4. Bibasilar atelectasis. 5. Dilated main pulmonary artery which can be seen in patients with elevated pulmonary artery pressures. Electronically Signed   By: Constance Holster M.D.   On: 08/17/2018 20:50   Dg Chest Port 1 View  Result  Date: 08/17/2018 CLINICAL DATA:  Shortness of breath EXAM: PORTABLE CHEST 1 VIEW COMPARISON:  None. FINDINGS: Small right pleural effusion. No left pleural effusion. No pneumothorax. No focal consolidation. Normal cardiomediastinal silhouette. No aggressive osseous lesion. IMPRESSION: 1. Small right pleural effusion. Electronically Signed   By: Kathreen Devoid   On: 08/17/2018 18:53  Assessment and Plan:   1. Severe anemia, appears iron deficient - clinical scenario is very odd. Her last episode of bleeding was over a month and a half ago, yet her hemoglobin is at the point where I would not have expected her to survive that long with that low of a blood count. Definitely needs medical workup of her anemia in addition to consideration of input from OBGYN while inpatient, given life threatening nature. It seems strange that despite the hemoglobin being one of the lowest I've ever seen clinically, she is remarkably well compensated without signs of hypovolemic shock - has preserved BP, max HR only low 100s, and troponin negative despite what I would've expected to be a situation of demand ischemia. Patient otherwise had brushed off symptoms if it weren't for sister who raised concern. Will be interested to see what repeat Hgb is following transfusion. Nurse has confirmed with lab plans for re-draw post transfusion. Add hemoccult for completeness. May need iron infusion to help restore stores as well.  2. Acute CHF - suspect high output related to severe anemia. I would hope EF is preserved based on the fact that even with severe anemia she was still able to maintain her blood pressure. Await echocardiogram. Treat anemia. Initial troponin shockingly normal. There is no role for cycling troponins so will cancel order (I do not think we should be subjecting her to any more blood draws than clinically necessary). Will also f/u PA pressures on echo. O2 sat is normal. She has been diuresing well with single dose of IV  Lasix early this AM per her report - she was incontinent with first urination which she says was profound amount, so UOP not yet recorded. Obtain strict I/O's and daily weights. I will discuss additional dosing with Dr. Radford Pax.   3. Pericardial effusion - small-moderate by CT, but CT is not best way to quantify. Echocardiogram pending. She is not presently manifesting any clinical signs to suggest tamponade.   4. Hypothyroidism - TSH pending.  For questions or updates, please contact [place] Please consult [email or web address] for contact info under Cardiology/STEMI.    Signed, Charlie Pitter, PA-C  08/18/2018 9:02 AM

## 2018-08-18 NOTE — Progress Notes (Signed)
[facility] PLAN OF CARE NOTE Patient: Gwendolyn Mclean RCV:[id number]   PCP: Patient, No Pcp Per DOB: 1960-08-08   DOA: 08/17/2018   DOS: 08/18/2018    Patient was admitted by my colleague Dr. Roel Cluck earlier on 08/18/2018. I have reviewed the H&P as well as assessment and plan and agree with the same. Important changes in the plan are listed below.  Plan of care: Active Problems:   Symptomatic anemia   Hypothyroidism   Acute CHF (congestive heart failure) (HCC)   Elevated lactic acid level   Fluid overload   Acute diastolic CHF (congestive heart failure) (HCC)  Chronic blood loss anemia secondary to menorrhagia. Concern for possible malignant bleed.  Patient adamantly refusing further work-up here in the hospital. Currently no active bleeding reported by patient. Hemoglobin trending up from 2-5 appropriately after 3 PRBC transfusion. We will provide 1 more PRBC transfusion.  Monitor CBC.  Acute on chronic diastolic CHF. Hyperdynamic circulation secondary to anemia. Primary reason for patient's presentation to the hospital. [place] cardiology input. Started on IV Lasix 40 mg twice daily. Will require extensive diuresis inpatient.   Author: Berle Mull, MD [facility] 08/18/2018 2:50 PM   If 7PM-7AM, please contact night-coverage at [email or web address]

## 2018-08-18 NOTE — H&P (Addendum)
Gwendolyn Mclean ZOX:[id number] DOB: [birth year] DOA: 08/17/2018     PCP: Patient, No Pcp Per   Outpatient Specialists:   NONE  ObGYN in [place] area but not local  Patient arrived to ER on 08/17/18 at 1555  Patient coming from: home Lives   With family    Chief Complaint:  Chief Complaint  Patient presents with  . Shortness of Breath    HPI: Gwendolyn Mclean is a 58 y.o. female with medical history significant of perimenopausal, heavy vaginal bleeding, hypothyroidism   Presented with   Shortness breath In February she have had severe vaginal bleeding she did not see any MD She spent time in bed and it got better but for few months she was having daily heavy menstrual  Bleeding having to change pads up to every 15 min   Severe shortness of breath now bilateral leg edema Last time she have seen a MD was in [place] was about 1 year ago Her family has been shopping She does not come out of the house Reports feeling weak and out of breath no chest pain  Currently bleeding have stopped    Infectious risk factors:  Reports shortness of breath, cough, reports no contacts   In  ER RAPID COVID TEST NEGATIVE      Regarding pertinent Chronic problems:  vaginal bleeding in the past   on synthroid for hypothyroidism  While in ER: BNP 451 CXR small pleural effusion Trop negative  The following Work up has been ordered so far:  Orders Placed This Encounter  Procedures  . Critical Care  . SARS Coronavirus 2 (CEPHEID - Performed in [place] hospital lab), [facility]  . DG Chest Port 1 View  . CT Angio Chest PE W and/or Wo Contrast  . Basic metabolic panel  . D-dimer, quantitative (not at [facility])  . Troponin I - ONCE - STAT  . Urinalysis, Routine w reflex microscopic  . CBC with Differential/Platelet  . CBC  . Brain natriuretic peptide  . Vitamin B12  . Folate  . Iron and TIBC  . Ferritin  . Reticulocytes  . Magnesium  . TSH  . Phosphorus  . CBC with Differential/Platelet   . Hepatic function panel  . Troponin I - Now Then Q6H  . Lactic acid, plasma  . Protime-INR  . If O2 Sat <94% administer O2 at 2 liters/minute via nasal cannula  . Cardiac monitoring  . [place] Lock IV, Maintain IV access  . Complete patient signature process for consent form  . Practitioner attestation of consent  . Complete patient signature process for consent form  . CBC post transfusion - RN to place lab order with appropriate draw time  . Cardiac monitoring  . Consult to hospitalist  [phone]  . Consult to case management  . Pulse oximetry, continuous  . Pulse oximetry, continuous  . I-Stat beta hCG blood, ED  . EKG 12-Lead  . ED EKG  . ECHOCARDIOGRAM COMPLETE  . Type and screen [place]  . ABO/Rh  . Prepare RBC  . Place in observation (patient's expected length of stay will be less than 2 midnights)    Following Medications were ordered in ER: Medications  sodium chloride flush (NS) 0.9 % injection 3 mL (0 mLs Intravenous Hold 08/17/18 1735)  0.9 %  sodium chloride infusion (has no administration in time range)  0.9 %  sodium chloride infusion (Manually program via Guardrails IV Fluids) (has no administration in time range)  furosemide (LASIX) injection 20 mg (has no administration in time range)  iohexol (OMNIPAQUE) 350 MG/ML injection 100 mL (100 mLs Intravenous Contrast Given 08/17/18 2030)        Consult Orders  (From admission, onward)         Start     Ordered   08/18/18 0116  Consult to case management  Once    Comments: Heart failure home health screen and may place order for PT/OT eval and treat if indicated.  Provider:  (Not yet assigned)  Question:  Reason for consult:  Answer:  Other (see comments)   08/18/18 0118   08/18/18 0032  Consult to hospitalist  [phone]  Once    Comments: [id number]  Provider:  (Not yet assigned)  Question Answer Comment  Place call to: [facility]   Reason for Consult Admit      08/18/18 0031            Significant initial  Findings: Abnormal Labs Reviewed  BASIC METABOLIC PANEL - Abnormal; Notable for the following components:      Result Value   Sodium 133 (*)    CO2 17 (*)    Glucose, Bld 107 (*)    Calcium 8.6 (*)    All other components within normal limits  D-DIMER, QUANTITATIVE (NOT AT [facility]) - Abnormal; Notable for the following components:   D-Dimer, Quant 2.31 (*)    All other components within normal limits  CBC - Abnormal; Notable for the following components:   RBC 1.44 (*)    Hemoglobin 2.0 (*)    HCT 9.2 (*)    MCV 63.9 (*)    MCH 13.9 (*)    MCHC 21.7 (*)    RDW 21.6 (*)    nRBC 0.4 (*)    All other components within normal limits  BRAIN NATRIURETIC PEPTIDE - Abnormal; Notable for the following components:   B Natriuretic Peptide 451.0 (*)    All other components within normal limits  IRON AND TIBC - Abnormal; Notable for the following components:   Iron 8 (*)    TIBC 524 (*)    Saturation Ratios 2 (*)    All other components within normal limits  FERRITIN - Abnormal; Notable for the following components:   Ferritin 1 (*)    All other components within normal limits  RETICULOCYTES - Abnormal; Notable for the following components:   RBC. 1.35 (*)    Immature Retic Fract 23.1 (*)    All other components within normal limits     Otherwise labs showing:    Recent Labs  Lab 08/17/18 1714  NA 133*  K 4.1  CO2 17*  GLUCOSE 107*  BUN 10  CREATININE 0.76  CALCIUM 8.6*    Cr  stable,   Lab Results  Component Value Date   CREATININE 0.76 08/17/2018    No results for input(s): AST, ALT, ALKPHOS, BILITOT, PROT, ALBUMIN in the last 168 hours. Lab Results  Component Value Date   CALCIUM 8.6 (L) 08/17/2018       WBC      Component Value Date/Time   WBC 4.7 08/17/2018 2252     Plt: Lab Results  Component Value Date   PLT 218 08/17/2018   Lactic Acid, Venous No results found for: LATICACIDVEN    COVID-19 Labs  Recent Labs     08/17/18 1812 08/18/18 0028  DDIMER 2.31*  --   FERRITIN  --  1*    Lab Results  Component Value Date   SARSCOV2NAA NEGATIVE 08/17/2018      HG/HCT  Down      Component Value Date/Time   HGB 2.0 (LL) 08/17/2018 2252   HCT 9.2 (L) 08/17/2018 2252     Troponin  Cardiac Panel (last 3 results) Recent Labs    08/17/18 1812  TROPONINI <0.03    BNP (last 3 results) Recent Labs    08/17/18 2252  BNP 451.0*     CBG (last 3)  No results for input(s): GLUCAP in the last 72 hours.     UA   ordered      CXR - small effusion  CTA chest -  no PE, dilated main pulmonary artery   ECG:  Personally reviewed by me showing: HR : 102 Rhythm:  Sinus tachycardia    no evidence of ischemic changes QTC 447     ED Triage Vitals  Enc Vitals Group     BP 08/17/18 1615 135/65     Pulse Rate 08/17/18 1615 (!) 101     Resp 08/17/18 1615 20     Temp 08/17/18 1615 98.5 F (36.9 C)     Temp Source 08/17/18 1615 Oral     SpO2 08/17/18 1615 100 %     Weight 08/17/18 1712 170 lb (77.1 kg)     Height 08/17/18 1712 5\' 3"  (1.6 m)     Head Circumference --      Peak Flow --      Pain Score 08/17/18 1711 0     Pain Loc --      Pain Edu? --      Excl. in [place]? --   TMAX(24)@      Latest  Blood pressure (!) 113/50, pulse 99, temperature 98.5 F (36.9 C), temperature source Oral, resp. rate (!) 34, height 5\' 3"  (1.6 m), weight 77.1 kg, SpO2 100 %.     Hospitalist was called for admission for symptomatic anemia   Review of Systems:    Pertinent positives include:  shortness of breath at rest.  dyspnea on exertion,   Constitutional:  No weight loss, night sweats, Fevers, chills, fatigue, weight loss  HEENT:  No headaches, Difficulty swallowing,Tooth/dental problems,Sore throat,  No sneezing, itching, ear ache, nasal congestion, post nasal drip,  Cardio-vascular:  No chest pain, Orthopnea, PND, anasarca, dizziness, palpitations.no Bilateral lower extremity swelling  GI:  No  heartburn, indigestion, abdominal pain, nausea, vomiting, diarrhea, change in bowel habits, loss of appetite, melena, blood in stool, hematemesis Resp:   excess mucus, no productive cough, No non-productive cough, No coughing up of blood.No change in color of mucus.No wheezing. Skin:  no rash or lesions. No jaundice GU:  no dysuria, change in color of urine, no urgency or frequency. No straining to urinate.  No flank pain.  Musculoskeletal:  No joint pain or no joint swelling. No decreased range of motion. No back pain.  Psych:  No change in mood or affect. No depression or anxiety. No memory loss.  Neuro: no localizing neurological complaints, no tingling, no weakness, no double vision, no gait abnormality, no slurred speech, no confusion  All systems reviewed and apart from [place] all are negative  Past Medical History:  History reviewed. No pertinent past medical history.    History reviewed. No pertinent surgical history.  Social History:  Ambulatory   independently       reports that she has never smoked. She has never used smokeless tobacco. She reports that she does not  drink alcohol or use drugs.     Family History:   Family History  Problem Relation Age of Onset  . Hypertension Mother   . CAD Mother   . Diabetes Neg Hx   . Cancer Neg Hx     Allergies: No Known Allergies   Prior to Admission medications   Medication Sig Start Date End Date Taking? Authorizing Provider  levothyroxine (SYNTHROID) 100 MCG tablet Take 100 mcg by mouth daily before breakfast.   Yes [provider]   Physical Exam: Blood pressure (!) 113/50, pulse 99, temperature 98.5 F (36.9 C), temperature source Oral, resp. rate (!) 34, height 5\' 3"  (1.6 m), weight 77.1 kg, SpO2 100 %. 1. General:  in No  Acute distress   Chronically ill pale-appearing 2. Psychological: Alert and  Oriented 3. Head/ENT:     Dry Mucous Membranes                          Head Non traumatic, neck  supple                           Poor Dentition 4. SKIN: normal  Skin turgor,  Skin clean Dry and intact no rash 5. Heart: Regular rate and rhythm no Murmur, no Rub or gallop 6. Lungs:   no wheezes or crackles   7. Abdomen: Soft,  non-tender, Non distended   Obese bowel sounds present 8. Lower extremities: no clubbing, cyanosis, 2+ edema 9. Neurologically Grossly intact, moving all 4 extremities equally   10. MSK: Normal range of motion   All other LABS:     Recent Labs  Lab 08/17/18 2252  WBC 4.7  HGB 2.0*  HCT 9.2*  MCV 63.9*  PLT 218     Recent Labs  Lab 08/17/18 1714  NA 133*  K 4.1  CL 106  CO2 17*  GLUCOSE 107*  BUN 10  CREATININE 0.76  CALCIUM 8.6*     No results for input(s): AST, ALT, ALKPHOS, BILITOT, PROT, ALBUMIN in the last 168 hours.     Cultures: No results found for: SDES, SPECREQUEST, CULT, REPTSTATUS   Radiological Exams on Admission: Ct Angio Chest Pe W And/or Wo Contrast  Result Date: 08/17/2018 CLINICAL DATA:  Productive cough. Shortness of breath times several months. EXAM: CT ANGIOGRAPHY CHEST WITH CONTRAST TECHNIQUE: Multidetector CT imaging of the chest was performed using the standard protocol during bolus administration of intravenous contrast. Multiplanar CT image reconstructions and MIPs were obtained to evaluate the vascular anatomy. CONTRAST:  142mL OMNIPAQUE IOHEXOL 350 MG/ML SOLN COMPARISON:  None. FINDINGS: Cardiovascular: The heart is enlarged. There is a small to moderate-sized pericardial effusion. The main pulmonary artery is dilated measuring up to approximately 3.6 cm in diameter. Detection of pulmonary emboli is limited by motion artifact and suboptimal contrast bolus timing. Given this limitation, there is no definite PE. Detection of small segmental and subsegmental pulmonary emboli is not possible on this exam. Mediastinum/Nodes: No enlarged mediastinal, hilar, or axillary lymph nodes. Thyroid gland, trachea, and esophagus  demonstrate no significant findings. Lungs/Pleura: There is a moderate-sized right-sided pleural effusion. There is a small left-sided pleural effusion. There is some interlobular septal thickening bilaterally consistent with volume overload. There is atelectasis at the lung bases, right worse than left. Detection of small pulmonary nodules is limited by significant motion artifact. Upper Abdomen: No acute abnormality. Musculoskeletal: No chest wall abnormality. No acute or significant  osseous findings. Review of the MIP images confirms the above findings. IMPRESSION: 1. Limited study secondary to contrast bolus timing and motion artifact. Given these limitations, no PE was identified. 2. Small to moderate-sized pericardial effusion. There is cardiomegaly with findings of volume overload. 3. Moderate right and small left pleural effusions. 4. Bibasilar atelectasis. 5. Dilated main pulmonary artery which can be seen in patients with elevated pulmonary artery pressures. Electronically Signed   By: Constance Holster M.D.   On: 08/17/2018 20:50   Dg Chest Port 1 View  Result Date: 08/17/2018 CLINICAL DATA:  Shortness of breath EXAM: PORTABLE CHEST 1 VIEW COMPARISON:  None. FINDINGS: Small right pleural effusion. No left pleural effusion. No pneumothorax. No focal consolidation. Normal cardiomediastinal silhouette. No aggressive osseous lesion. IMPRESSION: 1. Small right pleural effusion. Electronically Signed   By: Kathreen Devoid   On: 08/17/2018 18:53    Chart has been reviewed  Assessment/Plan   58 y.o. female with medical history significant of perimenopausal, heavy vaginal bleeding, hypothyroidism    Admitted for Symptomatic anemia hg down to 2.0, fluid overload if evidence of moderate pericardial effusion on CT  Present on Admission: . Symptomatic anemia -hemoglobin down to 2 secondary to prolonged vaginal bleeding At this point patient agreeable for blood transfusion and admission. Will write for 3  units with Lasix in between monitor in stepdown monitor for any worsening fluid overload Patient would like to have follow-up with OB/GYN as an outpatient and obtain pelvic ultrasound as an outpatient Currently stating her bleeding almost completely resolved  . Hypothyroidism- - Check TSH continue home medications at current dose  . Elevated lactic acid level -setting of severe symptomatic anemia.  Continue to monitor likely cause for metabolic acidosis . Fluid overload -CT scan worrisome for pericardial effusion patient with no prior history of CHF but given severe anemia could have developed nonischemic cardiomyopathy.  CTA negative for PE although evidence of pulmonary hypertension.  Would benefit from cardiology consult echogram ordered troponin will continue to cycle   Other plan as per orders.  DVT prophylaxis:  SCD   Code Status:  FULL CODE   as per patient   I had personally discussed CODE STATUS with patient   Family Communication:   Family not at  Bedside    Disposition Plan:  To home once workup is complete and patient is stable                                       Consults called: emailed cardiology   Admission status:  ED Disposition    ED Disposition Condition [place]: [place] [100102]  Level of Care: Stepdown [14]  Admit to SDU based on following criteria: Hemodynamic compromise or significant risk of instability:  Patient requiring short term acute titration and management of vasoactive drips, and invasive monitoring (i.e., CVP and Arterial line).  Covid Evaluation: N/A  Diagnosis: Symptomatic anemia [4008676]  Admitting Physician: Toy Baker [[id number]]  Attending Physician: Toy Baker [[id number]]  PT Class (Do Not Modify): Observation [104]  PT Acc Code (Do Not Modify): Observation [10022]        Obs    Level of care     SDU tele indefinitely please discontinue once patient no longer qualifies   Precautions:  NONE  No active isolations  PPE: Used by the provider:   P100  eye  Goggles,  Gloves      Ross Hefferan 08/18/2018, 1:49 AM    [facility]     after 2 AM please page floor coverage PA If 7AM-7PM, please contact the day team taking care of the patient using [email or web address]

## 2018-08-18 NOTE — Progress Notes (Signed)
  Echocardiogram 2D Echocardiogram has been performed.  Gwendolyn Mclean 08/18/2018, 10:09 AM

## 2018-08-18 NOTE — ED Notes (Signed)
ED TO INPATIENT HANDOFF REPORT  Name/Age/Gender Gwendolyn Mclean 58 y.o. female  Code Status   Home/SNF/Other Home  Chief Complaint SOB, Cough, ankle Pain, leg Pain and feet Pain  Level of Care/Admitting Diagnosis ED Disposition    ED Disposition Condition [facility] Area: [place] [100102]  Level of Care: Stepdown [14]  Admit to SDU based on following criteria: Hemodynamic compromise or significant risk of instability:  Patient requiring short term acute titration and management of vasoactive drips, and invasive monitoring (i.e., CVP and Arterial line).  Covid Evaluation: N/A  Diagnosis: Symptomatic anemia [6387564]  Admitting Physician: Toy Baker [[id number]]  Attending Physician: Toy Baker [[id number]]  PT Class (Do Not Modify): Observation [104]  PT Acc Code (Do Not Modify): Observation [10022]       Medical History History reviewed. No pertinent past medical history.  Allergies No Known Allergies  IV Location/Drains/Wounds Patient Lines/Drains/Airways Status   Active Line/Drains/Airways    Name:   Placement date:   Placement time:   Site:   Days:   Peripheral IV 08/17/18 Left Antecubital   08/17/18    1812    Antecubital   1   Peripheral IV 08/17/18 Right Antecubital   08/17/18    2350    Antecubital   1          Labs/Imaging Results for orders placed or performed during the hospital encounter of 08/17/18 (from the past 48 hour(s))  Basic metabolic panel     Status: Abnormal   Collection Time: 08/17/18  5:14 PM  Result Value Ref Range   Sodium 133 (L) 135 - 145 mmol/L   Potassium 4.1 3.5 - 5.1 mmol/L   Chloride 106 98 - 111 mmol/L   CO2 17 (L) 22 - 32 mmol/L   Glucose, Bld 107 (H) 70 - 99 mg/dL   BUN 10 6 - 20 mg/dL   Creatinine, Ser 0.76 0.44 - 1.00 mg/dL   Calcium 8.6 (L) 8.9 - 10.3 mg/dL   GFR calc non Af Amer >60 >60 mL/min   GFR calc Af Amer >60 >60 mL/min   Anion gap 10 5 - 15    Comment: Performed at  [facility], [place] [address], [place], [place] [address]  D-dimer, quantitative (not at [facility])     Status: Abnormal   Collection Time: 08/17/18  6:12 PM  Result Value Ref Range   D-Dimer, Quant 2.31 (H) 0.00 - 0.50 ug/mL-FEU    Comment: (NOTE) At the manufacturer cut-off of 0.50 ug/mL FEU, this assay has been documented to exclude PE with a sensitivity and negative predictive value of 97 to 99%.  At this time, this assay has not been approved by the FDA to exclude DVT/VTE. Results should be correlated with clinical presentation. Performed at [facility], [place] [address] [place], [place] [address]   Troponin I - ONCE - STAT     Status: None   Collection Time: 08/17/18  6:12 PM  Result Value Ref Range   Troponin I <0.03 <0.03 ng/mL    Comment: Performed at [facility], [place] [address] [place], [place] [address]  CBC     Status: Abnormal   Collection Time: 08/17/18 10:52 PM  Result Value Ref Range   WBC 4.7 4.0 - 10.5 K/uL   RBC 1.44 (L) 3.87 - 5.11 MIL/uL   Hemoglobin 2.0 (LL) 12.0 - 15.0 g/dL    Comment: REPEATED TO VERIFY RESULT CONFIRMED BY  2 RECOLLECTS THIS CRITICAL RESULT HAS VERIFIED AND BEEN CALLED TO Gwendolyn Mclean,A BY POTEAT,SHANNON ON 06 18 2020 AT 0018, AND HAS BEEN READ BACK. CRITICAL RESULT VERIFIED    HCT 9.2 (L) 36.0 - 46.0 %   MCV 63.9 (L) 80.0 - 100.0 fL   MCH 13.9 (L) 26.0 - 34.0 pg   MCHC 21.7 (L) 30.0 - 36.0 g/dL   RDW 21.6 (H) 11.5 - 15.5 %   Platelets 218 150 - 400 K/uL   nRBC 0.4 (H) 0.0 - 0.2 %    Comment: Performed at [facility], [place] [address], [place], [place] [address]  Brain natriuretic peptide     Status: Abnormal   Collection Time: 08/17/18 10:52 PM  Result Value Ref Range   B Natriuretic Peptide 451.0 (H) 0.0 - 100.0 pg/mL    Comment: Performed at [facility], [place] [address], [place], [place] [address]  Type and screen [place]      Status: None (Preliminary result)   Collection Time: 08/17/18 11:30 PM  Result Value Ref Range   ABO/RH(D) A POS    Antibody Screen NEG    Sample Expiration 08/20/2018,2359    Unit Number [id number]    Blood Component Type RED CELLS,LR    Unit division 00    Status of Unit ALLOCATED    Transfusion Status OK TO TRANSFUSE    Crossmatch Result Compatible    Unit Number [id number]    Blood Component Type RED CELLS,LR    Unit division 00    Status of Unit ISSUED    Transfusion Status OK TO TRANSFUSE    Crossmatch Result      Compatible Performed at [facility], [place] [address], [place], [place] [address]   ABO/Rh     Status: None (Preliminary result)   Collection Time: 08/17/18 11:30 PM  Result Value Ref Range   ABO/RH(D)      A POS Performed at [facility], [place] [address], [place],  [address]   SARS Coronavirus 2 (CEPHEID - Performed in [place] hospital lab), Hosp Order     Status: None   Collection Time: 08/17/18 11:38 PM   Specimen: Nasopharyngeal Swab  Result Value Ref Range   SARS Coronavirus 2 NEGATIVE NEGATIVE    Comment: (NOTE) If result is NEGATIVE SARS-CoV-2 target nucleic acids are NOT DETECTED. The SARS-CoV-2 RNA is generally detectable in upper and lower  respiratory specimens during the acute phase of infection. The lowest  concentration of SARS-CoV-2 viral copies this assay can detect is 250  copies / mL. A negative result does not preclude SARS-CoV-2 infection  and should not be used as the sole basis for treatment or other  patient management decisions.  A negative result may occur with  improper specimen collection / handling, submission of specimen other  than nasopharyngeal swab, presence of viral mutation(s) within the  areas targeted by this assay, and inadequate number of viral copies  (<250 copies / mL). A negative result must be combined with clinical  observations, patient history, and  epidemiological information. If result is POSITIVE SARS-CoV-2 target nucleic acids are DETECTED. The SARS-CoV-2 RNA is generally detectable in upper and lower  respiratory specimens dur ing the acute phase of infection.  Positive  results are indicative of active infection with SARS-CoV-2.  Clinical  correlation with patient history and other diagnostic information is  necessary to determine patient infection status.  Positive results do  not  rule out bacterial infection or co-infection with other viruses. If result is PRESUMPTIVE POSTIVE SARS-CoV-2 nucleic acids MAY BE PRESENT.   A presumptive positive result was obtained on the submitted specimen  and confirmed on repeat testing.  While 2019 novel coronavirus  (SARS-CoV-2) nucleic acids may be present in the submitted sample  additional confirmatory testing may be necessary for epidemiological  and / or clinical management purposes  to differentiate between  SARS-CoV-2 and other Sarbecovirus currently known to infect humans.  If clinically indicated additional testing with an alternate test  methodology [phone]) is advised. The SARS-CoV-2 RNA is generally  detectable in upper and lower respiratory sp ecimens during the acute  phase of infection. The expected result is Negative. Fact Sheet for Patients:  StrictlyIdeas.no Fact Sheet for Healthcare Providers: BankingDealers.co.za This test is not yet approved or cleared by the [place] FDA and has been authorized for detection and/or diagnosis of SARS-CoV-2 by FDA under an Emergency Use Authorization (EUA).  This EUA will remain in effect (meaning this test can be used) for the duration of the COVID-19 declaration under Section 564(b)(1) of the Act, 21 U.S.C. section 360bbb-3(b)(1), unless the authorization is terminated or revoked sooner. Performed at [facility], [place] [address], [place], [place] [address]    Vitamin B12     Status: None   Collection Time: 08/18/18 12:28 AM  Result Value Ref Range   Vitamin B-12 454 180 - 914 pg/mL    Comment: (NOTE) This assay is not validated for testing neonatal or myeloproliferative syndrome specimens for Vitamin B12 levels. Performed at [facility], [place] [address], [place], [place] [address]   Folate     Status: None   Collection Time: 08/18/18 12:28 AM  Result Value Ref Range   Folate 16.6 >5.9 ng/mL    Comment: Performed at [facility], [place] [address], [place], [place] [address]  Iron and TIBC     Status: Abnormal   Collection Time: 08/18/18 12:28 AM  Result Value Ref Range   Iron 8 (L) 28 - 170 ug/dL   TIBC 524 (H) 250 - 450 ug/dL   Saturation Ratios 2 (L) 10.4 - 31.8 %   UIBC 516 ug/dL    Comment: Performed at [facility], [place] [address] [place], [place] [address]  Ferritin     Status: Abnormal   Collection Time: 08/18/18 12:28 AM  Result Value Ref Range   Ferritin 1 (L) 11 - 307 ng/mL    Comment: Performed at [facility], [place] [address], [place], [place] [address]  Reticulocytes     Status: Abnormal   Collection Time: 08/18/18 12:28 AM  Result Value Ref Range   Retic Ct Pct 2.5 0.4 - 3.1 %   RBC. 1.35 (L) 3.87 - 5.11 MIL/uL   Retic Count, Absolute 34.2 19.0 - 186.0 K/uL   Immature Retic Fract 23.1 (H) 2.3 - 15.9 %    Comment: Performed at [facility], [place] [address] [place], [place] [address]  Lactic acid, plasma     Status: Abnormal   Collection Time: 08/18/18 12:57 AM  Result Value Ref Range   Lactic Acid, Venous 2.9 (HH) 0.5 - 1.9 mmol/L    Comment: CRITICAL RESULT CALLED TO, READ BACK BY AND VERIFIED WITH: RN Chauncey Cruel [facility] AT 0149 08/18/18 CRUICKSHANKA Performed at [facility], [place] [address] [place],  [address]    Ct Angio Chest Pe W And/or  Wo Contrast  Result Date: 08/17/2018 CLINICAL DATA:  Productive  cough. Shortness of breath times several months. EXAM: CT ANGIOGRAPHY CHEST WITH CONTRAST TECHNIQUE: Multidetector CT imaging of the chest was performed using the standard protocol during bolus administration of intravenous contrast. Multiplanar CT image reconstructions and MIPs were obtained to evaluate the vascular anatomy. CONTRAST:  165mL OMNIPAQUE IOHEXOL 350 MG/ML SOLN COMPARISON:  None. FINDINGS: Cardiovascular: The heart is enlarged. There is a small to moderate-sized pericardial effusion. The main pulmonary artery is dilated measuring up to approximately 3.6 cm in diameter. Detection of pulmonary emboli is limited by motion artifact and suboptimal contrast bolus timing. Given this limitation, there is no definite PE. Detection of small segmental and subsegmental pulmonary emboli is not possible on this exam. Mediastinum/Nodes: No enlarged mediastinal, hilar, or axillary lymph nodes. Thyroid gland, trachea, and esophagus demonstrate no significant findings. Lungs/Pleura: There is a moderate-sized right-sided pleural effusion. There is a small left-sided pleural effusion. There is some interlobular septal thickening bilaterally consistent with volume overload. There is atelectasis at the lung bases, right worse than left. Detection of small pulmonary nodules is limited by significant motion artifact. Upper Abdomen: No acute abnormality. Musculoskeletal: No chest wall abnormality. No acute or significant osseous findings. Review of the MIP images confirms the above findings. IMPRESSION: 1. Limited study secondary to contrast bolus timing and motion artifact. Given these limitations, no PE was identified. 2. Small to moderate-sized pericardial effusion. There is cardiomegaly with findings of volume overload. 3. Moderate right and small left pleural effusions. 4. Bibasilar atelectasis. 5. Dilated main pulmonary artery which can be seen in patients with elevated pulmonary artery pressures. Electronically Signed    By: Constance Holster M.D.   On: 08/17/2018 20:50   Dg Chest Port 1 View  Result Date: 08/17/2018 CLINICAL DATA:  Shortness of breath EXAM: PORTABLE CHEST 1 VIEW COMPARISON:  None. FINDINGS: Small right pleural effusion. No left pleural effusion. No pneumothorax. No focal consolidation. Normal cardiomediastinal silhouette. No aggressive osseous lesion. IMPRESSION: 1. Small right pleural effusion. Electronically Signed   By: Kathreen Devoid   On: 08/17/2018 18:53    Pending Labs Unresulted Labs (From admission, onward)    Start     Ordered   08/18/18 0500  Troponin I - Now Then Q6H  Now then every 6 hours,   R (with STAT occurrences)     08/18/18 0055   08/18/18 0500  Lactic acid, plasma  Once,   STAT     08/18/18 0155   08/18/18 0057  Protime-INR  Once,   STAT     08/18/18 0056   08/18/18 0055  Hepatic function panel  Add-on,   AD     08/18/18 0054   08/18/18 0054  Magnesium  Add-on,   AD     08/18/18 0053   08/18/18 0054  TSH  Add-on,   AD     08/18/18 0053   08/18/18 0054  Phosphorus  Add-on,   AD     08/18/18 0053   08/18/18 0054  CBC with Differential/Platelet  Add-on,   AD     08/18/18 0053   08/18/18 0030  Prepare RBC  (Adult Blood Administration - PRBC)  Once,   R    Question Answer Comment  # of Units 3 units   Transfusion Indications Symptomatic Anemia   If emergent release call blood bank Not emergent release      08/18/18 0029   08/17/18 1943  CBC with Differential/Platelet  Once,  R     08/17/18 1943   08/17/18 1814  Urinalysis, Routine w reflex microscopic  ONCE - STAT,   STAT     08/17/18 1814   Signed and Held  HIV antibody (Routine Testing)  Tomorrow morning,   R     Signed and Held   Signed and Held  Magnesium  Tomorrow morning,   R    Comments: Call MD if <1.5    Signed and Held   Signed and Held  Phosphorus  Tomorrow morning,   R     Signed and Held   Signed and Held  Comprehensive metabolic panel  Once,   R    Comments: Cal MD for K<3.5 or >5.0     Signed and Held   Signed and Held  CBC  Once,   R    Comments: Call for hg <8.0    Signed and Held          Vitals/Pain Today's Vitals   08/18/18 0100 08/18/18 0100 08/18/18 0107 08/18/18 0122  BP: 111/61  111/61 (!) 118/57  Pulse: 97 97  90  Resp: (!) 31 (!) 22  (!) 21  Temp:  98.8 F (37.1 C)  99 F (37.2 C)  TempSrc:  Oral  Oral  SpO2: 100% 100%  100%  Weight:      Height:      PainSc:        Isolation Precautions No active isolations  Medications Medications  sodium chloride flush (NS) 0.9 % injection 3 mL (0 mLs Intravenous Hold 08/17/18 1735)  0.9 %  sodium chloride infusion (has no administration in time range)  0.9 %  sodium chloride infusion (Manually program via Guardrails IV Fluids) (has no administration in time range)  furosemide (LASIX) injection 20 mg (has no administration in time range)  iohexol (OMNIPAQUE) 350 MG/ML injection 100 mL (100 mLs Intravenous Contrast Given 08/17/18 2030)    Mobility walks

## 2018-08-18 NOTE — ED Notes (Signed)
Date and time results received: 08/18/18 0018 (use smartphrase ".now" to insert current time)  Test: hemoglobin Critical Value: 2.0  Name of Provider Notified: Albrizze, Goodwin  Orders Received? Or Actions Taken?: Librarian, academic ordered

## 2018-08-18 NOTE — ED Notes (Signed)
Date and time results received: 08/18/18 1:49 AM   Test: Lactic  Critical Value: 2.9  Name of Provider Notified: Doutova, md

## 2018-08-18 NOTE — Progress Notes (Signed)
CRITICAL VALUE ALERT  Critical Value:  Hemoglobin 5.7  Date & Time Notied:  08/18/2018 1400  Provider Notified: Yes. Notified Berle Mull  Orders Received/Actions taken: No orders at this time.

## 2018-08-19 ENCOUNTER — Inpatient Hospital Stay (HOSPITAL_COMMUNITY): Payer: PRIVATE HEALTH INSURANCE

## 2018-08-19 DIAGNOSIS — E034 Atrophy of thyroid (acquired): Secondary | ICD-10-CM

## 2018-08-19 LAB — COMPREHENSIVE METABOLIC PANEL
ALT: 20 U/L (ref 0–44)
AST: 29 U/L (ref 15–41)
Albumin: 3.4 g/dL — ABNORMAL LOW (ref 3.5–5.0)
Alkaline Phosphatase: 39 U/L (ref 38–126)
Anion gap: 9 (ref 5–15)
BUN: 9 mg/dL (ref 6–20)
CO2: 22 mmol/L (ref 22–32)
Calcium: 8.1 mg/dL — ABNORMAL LOW (ref 8.9–10.3)
Chloride: 106 mmol/L (ref 98–111)
Creatinine, Ser: 0.63 mg/dL (ref 0.44–1.00)
GFR calc Af Amer: >60 mL/min (ref >60)
GFR calc non Af Amer: >60 mL/min (ref >60)
Glucose, Bld: 101 mg/dL — ABNORMAL HIGH (ref 70–99)
Potassium: 3.1 mmol/L — ABNORMAL LOW (ref 3.5–5.1)
Sodium: 137 mmol/L (ref 135–145)
Total Bilirubin: 2.7 mg/dL — ABNORMAL HIGH (ref 0.3–1.2)
Total Protein: 6.1 g/dL — ABNORMAL LOW (ref 6.5–8.1)

## 2018-08-19 LAB — URINALYSIS, ROUTINE W REFLEX MICROSCOPIC
Bacteria, UA: NONE SEEN
Bilirubin Urine: NEGATIVE
Glucose, UA: NEGATIVE mg/dL
Hgb urine dipstick: NEGATIVE
Ketones, ur: NEGATIVE mg/dL
Leukocytes,Ua: NEGATIVE
Nitrite: NEGATIVE
Protein, ur: NEGATIVE mg/dL
Specific Gravity, Urine: 1.004 — ABNORMAL LOW (ref 1.005–1.030)
pH: 6 (ref 5.0–8.0)

## 2018-08-19 LAB — CBC WITH DIFFERENTIAL/PLATELET
Abs Immature Granulocytes: 0.09 10*3/uL — ABNORMAL HIGH (ref 0.00–0.07)
Basophils Absolute: 0 10*3/uL (ref 0.0–0.1)
Basophils Relative: 1 %
Eosinophils Absolute: 0.2 10*3/uL (ref 0.0–0.5)
Eosinophils Relative: 3 %
HCT: 21.1 % — ABNORMAL LOW (ref 36.0–46.0)
Hemoglobin: 6.5 g/dL — CL (ref 12.0–15.0)
Immature Granulocytes: 2 %
Lymphocytes Relative: 21 %
Lymphs Abs: 1.2 10*3/uL (ref 0.7–4.0)
MCH: 23 pg — ABNORMAL LOW (ref 26.0–34.0)
MCHC: 30.8 g/dL (ref 30.0–36.0)
MCV: 74.8 fL — ABNORMAL LOW (ref 80.0–100.0)
Monocytes Absolute: 0.5 10*3/uL (ref 0.1–1.0)
Monocytes Relative: 8 %
Neutro Abs: 3.6 10*3/uL (ref 1.7–7.7)
Neutrophils Relative %: 65 %
Platelets: 187 10*3/uL (ref 150–400)
RBC: 2.82 MIL/uL — ABNORMAL LOW (ref 3.87–5.11)
RDW: 24.5 % — ABNORMAL HIGH (ref 11.5–15.5)
WBC: 5.6 10*3/uL (ref 4.0–10.5)
nRBC: 0.7 % — ABNORMAL HIGH (ref 0.0–0.2)

## 2018-08-19 LAB — HEMOGLOBIN AND HEMATOCRIT, BLOOD
HCT: 29.1 % — ABNORMAL LOW (ref 36.0–46.0)
Hemoglobin: 9.3 g/dL — ABNORMAL LOW (ref 12.0–15.0)

## 2018-08-19 LAB — PREPARE RBC (CROSSMATCH)

## 2018-08-19 LAB — MAGNESIUM: Magnesium: 2 mg/dL (ref 1.7–2.4)

## 2018-08-19 LAB — HIV ANTIBODY (ROUTINE TESTING W REFLEX): HIV Screen 4th Generation wRfx: NONREACTIVE

## 2018-08-19 LAB — PATHOLOGIST SMEAR REVIEW

## 2018-08-19 MED ORDER — POTASSIUM CHLORIDE CRYS ER 20 MEQ PO TBCR
20.0000 meq | EXTENDED_RELEASE_TABLET | Freq: Two times a day (BID) | ORAL | Status: DC
  Administered 2018-08-19 – 2018-08-21 (×5): 20 meq via ORAL
  Filled 2018-08-19 (×5): qty 1

## 2018-08-19 MED ORDER — POTASSIUM CHLORIDE CRYS ER 20 MEQ PO TBCR
40.0000 meq | EXTENDED_RELEASE_TABLET | Freq: Once | ORAL | Status: AC
  Administered 2018-08-19: 40 meq via ORAL
  Filled 2018-08-19: qty 2

## 2018-08-19 MED ORDER — FERROUS SULFATE 325 (65 FE) MG PO TABS
325.0000 mg | ORAL_TABLET | Freq: Two times a day (BID) | ORAL | Status: DC
  Administered 2018-08-20 – 2018-08-21 (×3): 325 mg via ORAL
  Filled 2018-08-19 (×3): qty 1

## 2018-08-19 MED ORDER — SENNOSIDES-DOCUSATE SODIUM 8.6-50 MG PO TABS
1.0000 | ORAL_TABLET | Freq: Two times a day (BID) | ORAL | Status: DC
  Administered 2018-08-19 – 2018-08-21 (×5): 1 via ORAL
  Filled 2018-08-19 (×5): qty 1

## 2018-08-19 MED ORDER — SODIUM CHLORIDE 0.9 % IV SOLN
510.0000 mg | Freq: Once | INTRAVENOUS | Status: AC
  Administered 2018-08-19: 510 mg via INTRAVENOUS
  Filled 2018-08-19: qty 17

## 2018-08-19 MED ORDER — BENZONATATE 100 MG PO CAPS: 100.0000 mg | ORAL_CAPSULE | Freq: Three times a day (TID) | ORAL | Status: DC | PRN

## 2018-08-19 MED ORDER — SODIUM CHLORIDE 0.9% IV SOLUTION
Freq: Once | INTRAVENOUS | Status: AC
  Administered 2018-08-19: 04:00:00 via INTRAVENOUS

## 2018-08-19 MED ORDER — B COMPLEX-C PO TABS
1.0000 | ORAL_TABLET | Freq: Every day | ORAL | Status: DC
  Administered 2018-08-19 – 2018-08-21 (×3): 1 via ORAL
  Filled 2018-08-19 (×3): qty 1

## 2018-08-19 NOTE — Progress Notes (Signed)
Progress Note  Patient Name: Gwendolyn Mclean Date of Encounter: 08/19/2018  Primary Cardiologist: Fransico Him, MD   Subjective   Breathing improved with Transfusion and diuretics.  Still has LE edema  Inpatient Medications    Scheduled Meds:  B-complex with vitamin C  1 tablet Oral Daily   Chlorhexidine Gluconate Cloth  6 each Topical Daily   [START ON 08/20/2018] ferrous sulfate  325 mg Oral BID WC   furosemide  40 mg Intravenous BID   levothyroxine  100 mcg Oral QAC breakfast   Living Better with Heart Failure Book   Does not apply Once   mouth rinse  15 mL Mouth Rinse BID   potassium chloride  20 mEq Oral BID   senna-docusate  1 tablet Oral BID   sodium chloride flush  3 mL Intravenous Q12H   Continuous Infusions:  sodium chloride     PRN Meds: sodium chloride, acetaminophen **OR** acetaminophen, benzonatate, HYDROcodone-acetaminophen, ondansetron **OR** ondansetron (ZOFRAN) IV, sodium chloride flush   Vital Signs    Vitals:   08/19/18 0804 08/19/18 0900 08/19/18 0915 08/19/18 0947  BP:  114/62 114/62 124/88  Pulse:  75 75 72  Resp:  (!) 27 (!) 24 20  Temp: 97.8 F (36.6 C)  97.8 F (36.6 C) 98.2 F (36.8 C)  TempSrc: Oral  Oral Oral  SpO2:  97% 97% 97%  Weight:    91.1 kg  Height:        Intake/Output Summary (Last 24 hours) at 08/19/2018 0954 Last data filed at 08/19/2018 0634 Gross per 24 hour  Intake 1305.67 ml  Output 1300 ml  Net 5.67 ml   Filed Weights   08/18/18 0500 08/19/18 0500 08/19/18 0947  Weight: 96.4 kg 85.7 kg 91.1 kg    Telemetry    NSR - Personally Reviewed  ECG    No new EKG to review - Personally Reviewed  Physical Exam   GEN: No acute distress.   Neck: No JVD Cardiac: RRR, no murmurs, rubs, or gallops.  Respiratory:fe crackles at bases bilaterally GI: Soft, nontender, non-distended  MS: 2+ pitting LE edema; No deformity. Neuro:  Nonfocal  Psych: Normal affect   Labs    Chemistry Recent Labs  Lab  08/17/18 1714 08/18/18 1339 08/19/18 0247  NA 133* 136 137  K 4.1 3.2* 3.1*  CL 106 105 106  CO2 17* 21* 22  GLUCOSE 107* 125* 101*  BUN 10 10 9   CREATININE 0.76 0.65 0.63  CALCIUM 8.6* 8.2* 8.1*  PROT  --  6.7 6.1*  ALBUMIN  --  3.8 3.4*  AST  --  26 29  ALT  --  22 20  ALKPHOS  --  44 39  BILITOT  --  3.7* 2.7*  GFRNONAA >60 >60 >60  GFRAA >60 >60 >60  ANIONGAP 10 10 9      Hematology Recent Labs  Lab 08/17/18 2252 08/18/18 0028 08/18/18 1341 08/18/18 2211 08/19/18 0247  WBC 4.7  --  5.6  --  5.6  RBC 1.44* 1.35* 2.57*  --  2.82*  HGB 2.0*  --  5.7* 7.1* 6.5*  HCT 9.2*  --  19.0* 23.2* 21.1*  MCV 63.9*  --  73.9*  --  74.8*  MCH 13.9*  --  22.2*  --  23.0*  MCHC 21.7*  --  30.0  --  30.8  RDW 21.6*  --  25.2*  --  24.5*  PLT 218  --  216  --  187  Cardiac Enzymes Recent Labs  Lab 08/17/18 1812  TROPONINI <0.03   No results for input(s): TROPIPOC in the last 168 hours.   BNP Recent Labs  Lab 08/17/18 2252  BNP 451.0*     DDimer  Recent Labs  Lab 08/17/18 1812  DDIMER 2.31*     Radiology    Ct Angio Chest Pe W And/or Wo Contrast  Result Date: 08/17/2018 CLINICAL DATA:  Productive cough. Shortness of breath times several months. EXAM: CT ANGIOGRAPHY CHEST WITH CONTRAST TECHNIQUE: Multidetector CT imaging of the chest was performed using the standard protocol during bolus administration of intravenous contrast. Multiplanar CT image reconstructions and MIPs were obtained to evaluate the vascular anatomy. CONTRAST:  153mL OMNIPAQUE IOHEXOL 350 MG/ML SOLN COMPARISON:  None. FINDINGS: Cardiovascular: The heart is enlarged. There is a small to moderate-sized pericardial effusion. The main pulmonary artery is dilated measuring up to approximately 3.6 cm in diameter. Detection of pulmonary emboli is limited by motion artifact and suboptimal contrast bolus timing. Given this limitation, there is no definite PE. Detection of small segmental and subsegmental  pulmonary emboli is not possible on this exam. Mediastinum/Nodes: No enlarged mediastinal, hilar, or axillary lymph nodes. Thyroid gland, trachea, and esophagus demonstrate no significant findings. Lungs/Pleura: There is a moderate-sized right-sided pleural effusion. There is a small left-sided pleural effusion. There is some interlobular septal thickening bilaterally consistent with volume overload. There is atelectasis at the lung bases, right worse than left. Detection of small pulmonary nodules is limited by significant motion artifact. Upper Abdomen: No acute abnormality. Musculoskeletal: No chest wall abnormality. No acute or significant osseous findings. Review of the MIP images confirms the above findings. IMPRESSION: 1. Limited study secondary to contrast bolus timing and motion artifact. Given these limitations, no PE was identified. 2. Small to moderate-sized pericardial effusion. There is cardiomegaly with findings of volume overload. 3. Moderate right and small left pleural effusions. 4. Bibasilar atelectasis. 5. Dilated main pulmonary artery which can be seen in patients with elevated pulmonary artery pressures. Electronically Signed   By: Constance Holster M.D.   On: 08/17/2018 20:50   Dg Chest Port 1 View  Result Date: 08/17/2018 CLINICAL DATA:  Shortness of breath EXAM: PORTABLE CHEST 1 VIEW COMPARISON:  None. FINDINGS: Small right pleural effusion. No left pleural effusion. No pneumothorax. No focal consolidation. Normal cardiomediastinal silhouette. No aggressive osseous lesion. IMPRESSION: 1. Small right pleural effusion. Electronically Signed   By: Kathreen Devoid   On: 08/17/2018 18:53    Cardiac Studies   2D echo 08/18/2018 IMPRESSIONS   1. The left ventricle has hyperdynamic systolic function, with an ejection fraction of >65%. The cavity size was normal. There is mildly increased left ventricular wall thickness. Left ventricular diastolic Doppler parameters are consistent with   impaired relaxation. No evidence of left ventricular regional wall motion abnormalities.  2. Mild increase in LVOT velocity (2.44m/s) secondary to hyperdynamic function.  3. The right ventricle has normal systolic function. The cavity was normal. There is no increase in right ventricular wall thickness.  4. The aortic valve was not well visualized.  Patient Profile     58 y.o. female with a hx of hypothyroidism and obesity but no cardiac hx who was admitted with a 4 months hx of DOE associated with vaginal bleeding for about the same amount of time.  She has not had any CP or palpitations but started having LE edema several weeks ago.  She saw her PCP and was noted to have a Hbg  of 2, HCT 9.2 and Ferritin of 1.  She was admitted to the hospital and Chest CT showed no PE but small to moderate pericardial effusion with CM, moderate right pleural effusion c/w CHF  Assessment & Plan    1. Severe anemia -appears iron deficient  -Hbg initially 2 and ferritin 1 -secondary to severe vaginal bleeding although a month ago -s/p several PRBC transfusions -Hb 6.5 this am -Pelvic US pending  2. Acute diastolic CHF  -suspect high output related to severe anemia.  -2D echo with normal LVF  -she put out  1.9L yesterday and is net + 500cc. -still volume overloaded -continue Lasix 40mg  IV BID -should improve with treatment of anemia  3.  Hypokalemia -secondary to diuresis -replete to keep K+>4 -Mag normal  3. Pericardial effusion  -small-moderate by CT.  -no effusion on echo  4. Hypothyroidism  - TSH pending.      For questions or updates, please contact [place] Please consult [email or web address] for contact info under Cardiology/STEMI.      Signed, Fransico Him, MD  08/19/2018, 9:54 AM

## 2018-08-19 NOTE — Progress Notes (Signed)
[facility] Progress Note  Patient: Gwendolyn Mclean ZOX:[id number]   PCP: Patient, No Pcp Per DOB: [birth year]   DOA: 08/17/2018   DOS: 08/19/2018   Date of Service: the patient was seen and examined on 08/19/2018  Brief hospital course: Pt. with PMH of hypothyroidism, polycystic ovarian kidney disease, menorrhagia; admitted on 08/17/2018, presented with complaint of shortness of breath and swelling of the leg, was found to have symptomatic anemia and acute on chronic diastolic CHF. Currently further plan is continue diuresis and transfuse PRBC for hemoglobin less than 7.  Subjective: Continues to have shortness of breath and swelling with improvement.  No nausea no vomiting.  No bleeding reported.  Constipated.  Assessment and Plan: 1.  Symptomatic anemia. History of menorrhagia. Patient does not have any active bleeding right now.  Last period of bleeding was a few months ago. She has history of heavy menstrual bleeding secondary to her PCOS and never had a prolonged.  Of aminuria. Starting in February she started having heavy episodes frequently with intermittent bleeding lasting for almost 2 months. No further bleeding after that. Hemoglobin coming up appropriately after transfusion. So far 3 PRBC will provide another fourth PRBC today. Ultrasound pelvis ordered. Outpatient OB/GYN consultation for endometrial biopsy recommended.  2.  Iron deficiency. From bleeding. We will provide IV Feraheme and start her on oral iron and B complex supplementation.  3.  Acute on chronic diastolic CHF. Responding appropriately with IV diuresis. Continue IV Lasix 40 mg twice daily.  May need to increase the dose. Appreciate cardiology assistance. Echocardiogram shows preserved EF. Hyperdynamic circulation as expected with anemia. TSH normal.  4.  Hypothyroidism. Continue Synthroid.  5.  Hypokalemia. Currently being replaced.  6.  Obesity Body mass index is 33.47 kg/m.   Dietary  consultation.  Diet: Cardiac diet DVT Prophylaxis: SCD, pharmacological prophylaxis contraindicated due to Anemia  Advance goals of care discussion: Full code  Family Communication: no family was present at bedside, at the time of interview. The pt provided permission to discuss medical plan with the family. Discussed with patient sister on 08/19/2018  opportunity was given to ask question and all questions were answered satisfactorily.   Disposition:  Discharge to Home probably Sunday, 08/21/2018. Transfer to telemetry  Consultants: Cardiology Procedures: Echocardiogram  Scheduled Meds: . B-complex with vitamin C  1 tablet Oral Daily  . Chlorhexidine Gluconate Cloth  6 each Topical Daily  . [START ON 08/20/2018] ferrous sulfate  325 mg Oral BID WC  . furosemide  40 mg Intravenous BID  . levothyroxine  100 mcg Oral QAC breakfast  . Living Better with Heart Failure Book   Does not apply Once  . mouth rinse  15 mL Mouth Rinse BID  . potassium chloride  20 mEq Oral BID  . sodium chloride flush  3 mL Intravenous Q12H   Continuous Infusions: . sodium chloride    . ferumoxytol     PRN Meds: sodium chloride, acetaminophen **OR** acetaminophen, benzonatate, HYDROcodone-acetaminophen, ondansetron **OR** ondansetron (ZOFRAN) IV, sodium chloride flush Antibiotics: Anti-infectives (From admission, onward)   None       Objective: Physical Exam: Vitals:   08/19/18 0649 08/19/18 0700 08/19/18 0800 08/19/18 0804  BP: 135/63 132/69 124/67   Pulse: 90 72 83   Resp: 20 (!) 25 (!) 26   Temp: 97.9 F (36.6 C)   97.8 F (36.6 C)  TempSrc: Oral   Oral  SpO2: 100% 98% 97%   Weight:      Height:  Intake/Output Summary (Last 24 hours) at 08/19/2018 0844 Last data filed at 08/19/2018 0634 Gross per 24 hour  Intake 1305.67 ml  Output 1950 ml  Net -644.33 ml   Filed Weights   08/17/18 1712 08/18/18 0500 08/19/18 0500  Weight: 77.1 kg 96.4 kg 85.7 kg   General: alert and oriented  to time, place, and person. Appear in mild distress, affect appropriate Eyes: PERRL, Conjunctiva normal ENT: Oral Mucosa Clear, moist  Neck: no JVD, no Abnormal Mass Or lumps Cardiovascular: S1 and S2 Present, no Murmur, peripheral pulses symmetrical Respiratory: normal respiratory effort, Bilateral Air entry equal and Decreased, no use of accessory muscle, basal Crackles, no wheezes Abdomen: Bowel Sound present, Soft and no tenderness, no hernia Skin: no rashes  Extremities: bilateral  Pedal edema, no calf tenderness Neurologic: normal without focal findings, mental status, speech normal, alert and oriented x3, PERLA, Motor strength 5/5 and symmetric and sensation grossly normal to light touch Gait not checked due to patient safety concerns  Data Reviewed: CBC: Recent Labs  Lab 08/17/18 2252 08/18/18 1341 08/18/18 2211 08/19/18 0247  WBC 4.7 5.6  --  5.6  NEUTROABS  --  4.3  --  3.6  HGB 2.0* 5.7* 7.1* 6.5*  HCT 9.2* 19.0* 23.2* 21.1*  MCV 63.9* 73.9*  --  74.8*  PLT 218 216  --  469   Basic Metabolic Panel: Recent Labs  Lab 08/17/18 1714 08/18/18 1339 08/19/18 0247  NA 133* 136 137  K 4.1 3.2* 3.1*  CL 106 105 106  CO2 17* 21* 22  GLUCOSE 107* 125* 101*  BUN 10 10 9   CREATININE 0.76 0.65 0.63  CALCIUM 8.6* 8.2* 8.1*  MG  --  2.3 2.0  PHOS  --  2.3*  --     Liver Function Tests: Recent Labs  Lab 08/18/18 1339 08/19/18 0247  AST 26 29  ALT 22 20  ALKPHOS 44 39  BILITOT 3.7* 2.7*  PROT 6.7 6.1*  ALBUMIN 3.8 3.4*   No results for input(s): LIPASE, AMYLASE in the last 168 hours. No results for input(s): AMMONIA in the last 168 hours. Coagulation Profile: Recent Labs  Lab 08/18/18 1334  INR 1.2   Cardiac Enzymes: Recent Labs  Lab 08/17/18 1812  TROPONINI <0.03   BNP (last 3 results) No results for input(s): PROBNP in the last 8760 hours. CBG: No results for input(s): GLUCAP in the last 168 hours. Studies: No results found.   Time spent: 35  minutes  Author: Berle Mull, MD [facility] 08/19/2018 8:44 AM  To reach On-call, see care teams to locate the attending and reach out to them via [email or web address]. If 7PM-7AM, please contact night-coverage If you still have difficulty reaching the attending provider, please page the [facility] (Director on Call) for [facility] on amion for assistance.

## 2018-08-20 LAB — CBC
HCT: 28.5 % — ABNORMAL LOW (ref 36.0–46.0)
Hemoglobin: 8.9 g/dL — ABNORMAL LOW (ref 12.0–15.0)
MCH: 24.6 pg — ABNORMAL LOW (ref 26.0–34.0)
MCHC: 31.2 g/dL (ref 30.0–36.0)
MCV: 78.7 fL — ABNORMAL LOW (ref 80.0–100.0)
Platelets: 166 10*3/uL (ref 150–400)
RBC: 3.62 MIL/uL — ABNORMAL LOW (ref 3.87–5.11)
RDW: 24.6 % — ABNORMAL HIGH (ref 11.5–15.5)
WBC: 7.1 10*3/uL (ref 4.0–10.5)
nRBC: 0.7 % — ABNORMAL HIGH (ref 0.0–0.2)

## 2018-08-20 LAB — TYPE AND SCREEN
ABO/RH(D): A POS
Antibody Screen: NEGATIVE
Unit division: 0
Unit division: 0
Unit division: 0
Unit division: 0
Unit division: 0
Unit division: 0

## 2018-08-20 LAB — BPAM RBC
Blood Product Expiration Date: 202007012359
Blood Product Expiration Date: 202007012359
Blood Product Expiration Date: 202007012359
Blood Product Expiration Date: 202007022359
Blood Product Expiration Date: 202007022359
Blood Product Expiration Date: 202007022359
ISSUE DATE / TIME: 202006180048
ISSUE DATE / TIME: 202006180438
ISSUE DATE / TIME: 202006180831
ISSUE DATE / TIME: 202006181511
ISSUE DATE / TIME: 202006190349
ISSUE DATE / TIME: 202006190630
Unit Type and Rh: 6200
Unit Type and Rh: 6200
Unit Type and Rh: 6200
Unit Type and Rh: 6200
Unit Type and Rh: 6200
Unit Type and Rh: 6200

## 2018-08-20 LAB — BASIC METABOLIC PANEL
Anion gap: 10 (ref 5–15)
BUN: 9 mg/dL (ref 6–20)
CO2: 23 mmol/L (ref 22–32)
Calcium: 8.6 mg/dL — ABNORMAL LOW (ref 8.9–10.3)
Chloride: 106 mmol/L (ref 98–111)
Creatinine, Ser: 0.57 mg/dL (ref 0.44–1.00)
GFR calc Af Amer: >60 mL/min (ref >60)
GFR calc non Af Amer: >60 mL/min (ref >60)
Glucose, Bld: 91 mg/dL (ref 70–99)
Potassium: 3.8 mmol/L (ref 3.5–5.1)
Sodium: 139 mmol/L (ref 135–145)

## 2018-08-20 LAB — MAGNESIUM: Magnesium: 2.1 mg/dL (ref 1.7–2.4)

## 2018-08-20 MED ORDER — FUROSEMIDE 10 MG/ML IJ SOLN: 40.0000 mg | Freq: Every day | INTRAMUSCULAR | Status: DC

## 2018-08-20 MED ORDER — FUROSEMIDE 40 MG PO TABS
40.0000 mg | ORAL_TABLET | Freq: Every day | ORAL | Status: DC
  Administered 2018-08-20 – 2018-08-21 (×2): 40 mg via ORAL
  Filled 2018-08-20 (×2): qty 1

## 2018-08-20 NOTE — Progress Notes (Signed)
[facility] Progress Note  Patient: Gwendolyn Mclean QIO:[id number]   PCP: Patient, No Pcp Per DOB: 01-Jan-1961   DOA: 08/17/2018   DOS: 08/20/2018   Date of Service: the patient was seen and examined on 08/20/2018  Brief hospital course: Pt. with PMH of hypothyroidism, polycystic ovarian kidney disease, menorrhagia; admitted on 08/17/2018, presented with complaint of shortness of breath and swelling of the leg, was found to have symptomatic anemia and acute on chronic diastolic CHF. Currently further plan is continue diuresis and transfuse PRBC for hemoglobin less than 7.  Subjective: Continues to have shortness of breath and swelling with improvement.  No nausea no vomiting.  No bleeding reported.  Constipation improving..  Assessment and Plan: 1.  Symptomatic anemia. History of menorrhagia. Patient does not have any active bleeding right now.  Last period of bleeding was a few months ago. She has history of heavy menstrual bleeding secondary to her PCOS and never had a prolonged.  Of aminuria. Starting in February she started having heavy episodes frequently with intermittent bleeding lasting for almost 2 months. No further bleeding after that. Hemoglobin coming up appropriately after transfusion. So far S/P 4 PRBC. Ultrasound pelvis shows uterine fibroid and endometrial wall thickening.. Outpatient OB/GYN consultation for endometrial biopsy recommended.  2.  Iron deficiency. From bleeding. We will provide IV Feraheme and start her on oral iron and B complex supplementation.  3.  Acute on chronic diastolic CHF. Responding appropriately with IV diuresis. Treated with IV Lasix 40 mg twice daily.  Transitioning to oral Lasix today.Marland Kitchen Appreciate cardiology assistance. Echocardiogram shows preserved EF. Hyperdynamic circulation as expected with anemia. TSH normal.  4.  Hypothyroidism. Continue Synthroid.  5.  Hypokalemia. Currently being replaced.  6.  Obesity Body mass index is  35.58 kg/m.   Dietary consultation.  Diet: Cardiac diet DVT Prophylaxis: SCD, pharmacological prophylaxis contraindicated due to Anemia  Advance goals of care discussion: Full code  Family Communication: no family was present at bedside, at the time of interview. The pt provided permission to discuss medical plan with the family. Discussed with patient sister on 08/19/2018  opportunity was given to ask question and all questions were answered satisfactorily.   Disposition:  Discharge to Home probably Sunday, 08/21/2018. Transfer to telemetry  Consultants: Cardiology Procedures: Echocardiogram  Scheduled Meds: . B-complex with vitamin C  1 tablet Oral Daily  . Chlorhexidine Gluconate Cloth  6 each Topical Daily  . ferrous sulfate  325 mg Oral BID WC  . furosemide  40 mg Oral Daily  . levothyroxine  100 mcg Oral QAC breakfast  . Living Better with Heart Failure Book   Does not apply Once  . mouth rinse  15 mL Mouth Rinse BID  . potassium chloride  20 mEq Oral BID  . senna-docusate  1 tablet Oral BID  . sodium chloride flush  3 mL Intravenous Q12H   Continuous Infusions: . sodium chloride     PRN Meds: sodium chloride, acetaminophen **OR** acetaminophen, benzonatate, HYDROcodone-acetaminophen, ondansetron **OR** ondansetron (ZOFRAN) IV, sodium chloride flush Antibiotics: Anti-infectives (From admission, onward)   None       Objective: Physical Exam: Vitals:   08/19/18 0947 08/19/18 2141 08/20/18 0535 08/20/18 1336  BP: 124/88 (!) 111/54 (!) 99/55 111/72  Pulse: 72 77 80 81  Resp: 20 18  20   Temp: 98.2 F (36.8 C) 98.7 F (37.1 C) 98.7 F (37.1 C) 98 F (36.7 C)  TempSrc: Oral Oral Oral Oral  SpO2: 97% 98% 95% 100%  Weight:  91.1 kg     Height:        Intake/Output Summary (Last 24 hours) at 08/20/2018 1443 Last data filed at 08/20/2018 1338 Gross per 24 hour  Intake 1380 ml  Output 5050 ml  Net -3670 ml   Filed Weights   08/18/18 0500 08/19/18 0500 08/19/18  0947  Weight: 96.4 kg 85.7 kg 91.1 kg   General: alert and oriented to time, place, and person. Appear in mild distress, affect appropriate Eyes: PERRL, Conjunctiva normal ENT: Oral Mucosa Clear, moist  Neck: no JVD, no Abnormal Mass Or lumps Cardiovascular: S1 and S2 Present, no Murmur, peripheral pulses symmetrical Respiratory: normal respiratory effort, Bilateral Air entry equal and Decreased, no use of accessory muscle, basal Crackles, no wheezes Abdomen: Bowel Sound present, Soft and no tenderness, no hernia Skin: no rashes  Extremities: bilateral  Pedal edema, no calf tenderness Neurologic: normal without focal findings, mental status, speech normal, alert and oriented x3, PERLA, Motor strength 5/5 and symmetric and sensation grossly normal to light touch Gait not checked due to patient safety concerns  Data Reviewed: CBC: Recent Labs  Lab 08/17/18 2252 08/18/18 1341 08/18/18 2211 08/19/18 0247 08/19/18 1116 08/20/18 0532  WBC 4.7 5.6  --  5.6  --  7.1  NEUTROABS  --  4.3  --  3.6  --   --   HGB 2.0* 5.7* 7.1* 6.5* 9.3* 8.9*  HCT 9.2* 19.0* 23.2* 21.1* 29.1* 28.5*  MCV 63.9* 73.9*  --  74.8*  --  78.7*  PLT 218 216  --  187  --  546   Basic Metabolic Panel: Recent Labs  Lab 08/17/18 1714 08/18/18 1339 08/19/18 0247 08/20/18 0532  NA 133* 136 137 139  K 4.1 3.2* 3.1* 3.8  CL 106 105 106 106  CO2 17* 21* 22 23  GLUCOSE 107* 125* 101* 91  BUN 10 10 9 9   CREATININE 0.76 0.65 0.63 0.57  CALCIUM 8.6* 8.2* 8.1* 8.6*  MG  --  2.3 2.0 2.1  PHOS  --  2.3*  --   --     Liver Function Tests: Recent Labs  Lab 08/18/18 1339 08/19/18 0247  AST 26 29  ALT 22 20  ALKPHOS 44 39  BILITOT 3.7* 2.7*  PROT 6.7 6.1*  ALBUMIN 3.8 3.4*   No results for input(s): LIPASE, AMYLASE in the last 168 hours. No results for input(s): AMMONIA in the last 168 hours. Coagulation Profile: Recent Labs  Lab 08/18/18 1334  INR 1.2   Cardiac Enzymes: Recent Labs  Lab 08/17/18  1812  TROPONINI <0.03   BNP (last 3 results) No results for input(s): PROBNP in the last 8760 hours. CBG: No results for input(s): GLUCAP in the last 168 hours. Studies: No results found.   Time spent: 35 minutes  Author: Berle Mull, MD [facility] 08/20/2018 2:43 PM  To reach On-call, see care teams to locate the attending and reach out to them via [email or web address]. If 7PM-7AM, please contact night-coverage If you still have difficulty reaching the attending provider, please page the [facility] (Director on Call) for [facility] on amion for assistance.

## 2018-08-20 NOTE — Progress Notes (Signed)
Progress Note  Patient Name: Texie Tupou Date of Encounter: 08/20/2018  Primary Cardiologist:   Fransico Him, MD   Subjective   She feels OK.  No SOB.  No pain.  Mild abdominal fullness  Inpatient Medications    Scheduled Meds: . B-complex with vitamin C  1 tablet Oral Daily  . Chlorhexidine Gluconate Cloth  6 each Topical Daily  . ferrous sulfate  325 mg Oral BID WC  . furosemide  40 mg Intravenous Daily  . levothyroxine  100 mcg Oral QAC breakfast  . Living Better with Heart Failure Book   Does not apply Once  . mouth rinse  15 mL Mouth Rinse BID  . potassium chloride  20 mEq Oral BID  . senna-docusate  1 tablet Oral BID  . sodium chloride flush  3 mL Intravenous Q12H   Continuous Infusions: . sodium chloride     PRN Meds: sodium chloride, acetaminophen **OR** acetaminophen, benzonatate, HYDROcodone-acetaminophen, ondansetron **OR** ondansetron (ZOFRAN) IV, sodium chloride flush   Vital Signs    Vitals:   08/19/18 0915 08/19/18 0947 08/19/18 2141 08/20/18 0535  BP: 114/62 124/88 (!) 111/54 (!) 99/55  Pulse: 75 72 77 80  Resp: (!) 24 20 18    Temp: 97.8 F (36.6 C) 98.2 F (36.8 C) 98.7 F (37.1 C) 98.7 F (37.1 C)  TempSrc: Oral Oral Oral Oral  SpO2: 97% 97% 98% 95%  Weight:  91.1 kg    Height:        Intake/Output Summary (Last 24 hours) at 08/20/2018 0801 Last data filed at 08/20/2018 0552 Gross per 24 hour  Intake 1400 ml  Output 7000 ml  Net -5600 ml   Filed Weights   08/18/18 0500 08/19/18 0500 08/19/18 0947  Weight: 96.4 kg 85.7 kg 91.1 kg    Telemetry    NSR - Personally Reviewed  ECG    NA - Personally Reviewed  Physical Exam   GEN: No acute distress.   Neck: No  JVD Cardiac: RRR, soft apical early peaking systolic murmur, no diastolic murmurs, rubs, or gallops.  Respiratory: Few basilar crackles on the right GI: Soft, nontender, non-distended  MS: No  edema; No deformity. Neuro:  Nonfocal  Psych: Normal affect   Labs     Chemistry Recent Labs  Lab 08/18/18 1339 08/19/18 0247 08/20/18 0532  NA 136 137 139  K 3.2* 3.1* 3.8  CL 105 106 106  CO2 21* 22 23  GLUCOSE 125* 101* 91  BUN 10 9 9   CREATININE 0.65 0.63 0.57  CALCIUM 8.2* 8.1* 8.6*  PROT 6.7 6.1*  --   ALBUMIN 3.8 3.4*  --   AST 26 29  --   ALT 22 20  --   ALKPHOS 44 39  --   BILITOT 3.7* 2.7*  --   GFRNONAA >60 >60 >60  GFRAA >60 >60 >60  ANIONGAP 10 9 10      Hematology Recent Labs  Lab 08/18/18 1341  08/19/18 0247 08/19/18 1116 08/20/18 0532  WBC 5.6  --  5.6  --  7.1  RBC 2.57*  --  2.82*  --  3.62*  HGB 5.7*   < > 6.5* 9.3* 8.9*  HCT 19.0*   < > 21.1* 29.1* 28.5*  MCV 73.9*  --  74.8*  --  78.7*  MCH 22.2*  --  23.0*  --  24.6*  MCHC 30.0  --  30.8  --  31.2  RDW 25.2*  --  24.5*  --  24.6*  PLT 216  --  187  --  166   < > = values in this interval not displayed.    Cardiac Enzymes Recent Labs  Lab 08/17/18 1812  TROPONINI <0.03   No results for input(s): TROPIPOC in the last 168 hours.   BNP Recent Labs  Lab 08/17/18 2252  BNP 451.0*     DDimer  Recent Labs  Lab 08/17/18 1812  DDIMER 2.31*     Radiology    US Pelvic Complete With Transvaginal  Result Date: 08/19/2018 CLINICAL DATA:  Menorrhagia EXAM: TRANSABDOMINAL AND TRANSVAGINAL ULTRASOUND OF PELVIS TECHNIQUE: Both transabdominal and transvaginal ultrasound examinations of the pelvis were performed. Transabdominal technique was performed for global imaging of the pelvis including uterus, ovaries, adnexal regions, and pelvic cul-de-sac. It was necessary to proceed with endovaginal exam following the transabdominal exam to visualize the endometrium. COMPARISON:  None FINDINGS: Uterus Measurements: 10.4 x 7.4 x 8.8 cm = volume: Heterogeneous mL. Asymmetric significant anterior wall thickening of the uterus either representing a poorly defined leiomyoma which cannot be accurately measured or an infiltrative process such as adenomyosis. No discrete margins  are identified. Endometrium Thickness: 12 mm. Small amount of endometrial fluid at the mid to lower uterine segments. Endometrial stripe at the upper uterine segment is not well visualized Right ovary Measurements: 4.3 x 2.0 x 3.6 cm = volume: 16.2 mL. Normal morphology without mass Left ovary Measurements: 3.9 x 1.9 x 4.3 cm = volume: 16.5 mL. Normal morphology without mass Other findings No free pelvic fluid.  No adnexal masses. IMPRESSION: Nonspecific endometrial fluid. Endometrial complex is 12 mm thick, normal, though the stripe is inadequately visualized in the upper uterine segment. Asymmetric wall thickening of the anterior uterus either representing a poorly defined anterior wall leiomyoma which extends submucosal or an infiltrative process such as adenomyosis. Electronically Signed   By: Lavonia Dana M.D.   On: 08/19/2018 10:15    Cardiac Studies   Echo:  1. The left ventricle has hyperdynamic systolic function, with an ejection fraction of >65%. The cavity size was normal. There is mildly increased left ventricular wall thickness. Left ventricular diastolic Doppler parameters are consistent with  impaired relaxation. No evidence of left ventricular regional wall motion abnormalities.  2. Mild increase in LVOT velocity (2.66m/s) secondary to hyperdynamic function.  3. The right ventricle has normal systolic function. The cavity was normal. There is no increase in right ventricular wall thickness.  4. The aortic valve was not well visualized.   Patient Profile     58 y.o. female with a hx of hypothyroidism and obesity but no cardiac hx who was admitted with a 4 months hx of DOE associated with vaginal bleeding for about the same amount of time. She has not had any CP or palpitations but started having LE edema several weeks ago. She saw her PCP and was noted to have a Hbg of 2, HCT 9.2 and Ferritin of 1. She was admitted to the hospital and Chest CT showed no PE but small to moderate  pericardial effusion with CM, moderate right pleural effusion c/w CHF   Assessment & Plan    ACUTE DIASTOLIC HF:  Likely high output.  NL EF as above.  I/O negative 5 liters since admission.  Change to PO Lasix today.  Likely will not need Lasix at discharge.  No change in therapy.    ANEMIA :  Per primary team.    PERICARDIAL EFFUSION:   None on echo.  No further cardiac work up.    For questions or updates, please contact [place] Please consult [email or web address] for contact info under Cardiology/STEMI.   Signed, Minus Breeding, MD  08/20/2018, 8:01 AM

## 2018-08-20 NOTE — Evaluation (Signed)
Physical Therapy Evaluation-1x Patient Details Name: Gwendolyn Mclean MRN: [id number] DOB: 05/27/1960 Today's Date: 08/20/2018   History of Present Illness  58 yo female admitted with anemia, CHF. Hx fo PCOS, DOE, heavy vaginal bleeding  Clinical Impression  On eval, pt was Mod Ind with mobility. She walked ~400 feet around the unit. Mildly unsteady once or twice but no LOB. Pt tolerated distance well. No follow up PT needs. 1x eval. Will sign off.     Follow Up Recommendations No PT follow up    Equipment Recommendations  None recommended by PT    Recommendations for Other Services       Precautions / Restrictions Precautions Precautions: None Restrictions Weight Bearing Restrictions: No      Mobility  Bed Mobility Overal bed mobility: Modified Independent                Transfers Overall transfer level: Modified independent                  Ambulation/Gait Ambulation/Gait assistance: Modified independent (Device/Increase time) Gait Distance (Feet): 400 Feet Assistive device: None Gait Pattern/deviations: WFL(Within Functional Limits)        Stairs            Wheelchair Mobility    Modified Rankin (Stroke Patients Only)       Balance Overall balance assessment: Mild deficits observed, not formally tested                                           Pertinent Vitals/Pain Pain Assessment: No/denies pain    Home Living Family/patient expects to be discharged to:: Private residence   Available Help at Discharge: Family Type of Home: House         Home Equipment: None      Prior Function Level of Independence: Independent               Hand Dominance        Extremity/Trunk Assessment   Upper Extremity Assessment Upper Extremity Assessment: Overall WFL for tasks assessed    Lower Extremity Assessment Lower Extremity Assessment: Overall WFL for tasks assessed    Cervical / Trunk Assessment Cervical /  Trunk Assessment: Normal  Communication   Communication: No difficulties  Cognition Arousal/Alertness: Awake/alert Behavior During Therapy: WFL for tasks assessed/performed Overall Cognitive Status: Within Functional Limits for tasks assessed                                        General Comments      Exercises     Assessment/Plan    PT Assessment [place] does not need any further PT services  PT Problem List         PT Treatment Interventions      PT Goals (Current goals can be found in the Care Plan section)  Acute Rehab PT Goals Patient Stated Goal: to regain PLOF PT Goal Formulation: All assessment and education complete, DC therapy    Frequency     Barriers to discharge        Co-evaluation               AM-PAC PT "6 Clicks" Mobility  Outcome Measure Help needed turning from your back to your side while in a flat bed without using  bedrails?: None Help needed moving from lying on your back to sitting on the side of a flat bed without using bedrails?: None Help needed moving to and from a bed to a chair (including a wheelchair)?: None Help needed standing up from a chair using your arms (e.g., wheelchair or bedside chair)?: None Help needed to walk in hospital room?: None Help needed climbing 3-5 steps with a railing? : None 6 Click Score: 24    End of Session   Activity Tolerance: Patient tolerated treatment well Patient left: in bed;with call bell/phone within reach        Time: 1056-1104 PT Time Calculation (min) (ACUTE ONLY): 8 min   Charges:   PT Evaluation $PT Eval Low Complexity: [place], PT Acute Rehabilitation Services Pager: [phone] Office: [phone]

## 2018-08-21 DIAGNOSIS — D500 Iron deficiency anemia secondary to blood loss (chronic): Secondary | ICD-10-CM

## 2018-08-21 LAB — CBC
HCT: 31 % — ABNORMAL LOW (ref 36.0–46.0)
Hemoglobin: 9 g/dL — ABNORMAL LOW (ref 12.0–15.0)
MCH: 24.5 pg — ABNORMAL LOW (ref 26.0–34.0)
MCHC: 29 g/dL — ABNORMAL LOW (ref 30.0–36.0)
MCV: 84.2 fL (ref 80.0–100.0)
Platelets: 155 10*3/uL (ref 150–400)
RBC: 3.68 MIL/uL — ABNORMAL LOW (ref 3.87–5.11)
RDW: 26.7 % — ABNORMAL HIGH (ref 11.5–15.5)
WBC: 8.1 10*3/uL (ref 4.0–10.5)
nRBC: 0.5 % — ABNORMAL HIGH (ref 0.0–0.2)

## 2018-08-21 LAB — BASIC METABOLIC PANEL
Anion gap: 11 (ref 5–15)
BUN: 9 mg/dL (ref 6–20)
CO2: 18 mmol/L — ABNORMAL LOW (ref 22–32)
Calcium: 8.9 mg/dL (ref 8.9–10.3)
Chloride: 111 mmol/L (ref 98–111)
Creatinine, Ser: 0.72 mg/dL (ref 0.44–1.00)
GFR calc Af Amer: >60 mL/min (ref >60)
GFR calc non Af Amer: >60 mL/min (ref >60)
Glucose, Bld: 93 mg/dL (ref 70–99)
Potassium: 3.8 mmol/L (ref 3.5–5.1)
Sodium: 140 mmol/L (ref 135–145)

## 2018-08-21 LAB — MAGNESIUM: Magnesium: 2.1 mg/dL (ref 1.7–2.4)

## 2018-08-21 MED ORDER — FUROSEMIDE 20 MG PO TABS: 20.0000 mg | ORAL_TABLET | Freq: Every day | ORAL | 0 refills | Status: DC

## 2018-08-21 MED ORDER — POTASSIUM CHLORIDE CRYS ER 10 MEQ PO TBCR: 10.0000 meq | EXTENDED_RELEASE_TABLET | Freq: Every day | ORAL | 0 refills | Status: DC

## 2018-08-21 MED ORDER — LEVOTHYROXINE SODIUM 100 MCG PO TABS: 100.0000 ug | ORAL_TABLET | Freq: Every day | ORAL | 0 refills | Status: DC

## 2018-08-21 NOTE — TOC Initial Note (Signed)
Transition of Care [facility]) - Initial/Assessment Note    Patient Details  Name: Gwendolyn Mclean MRN: [id number] Date of Birth: [birth year]  Transition of Care (TOC) CM/SW Contact:    Joaquin Courts, RN Phone Number: 08/21/2018, 11:57 AM  Clinical Narrative:   CM spoke with patient at bedside regarding need for primary care provider. Pt provided with list of providers available in this area as she requests a pcp in [place] versus [place] lives.  Cm informed patient that pcp offices are closed on Sunday and follow-up appointment cannot be scheduled today. Patient sates she will review the list and contact the md she selects on Monday to schedule follow-up and establish care.                  Expected Discharge Plan: Home/Self Care Barriers to Discharge: No Barriers Identified   Patient Goals and CMS Choice        Expected Discharge Plan and Services Expected Discharge Plan: Home/Self Care   Discharge Planning Services: CM Consult   Living arrangements for the past 2 months: Single Family Home Expected Discharge Date: 08/21/18               DME Arranged: N/A DME Agency: NA       HH Arranged: NA [place] Agency: NA        Prior Living Arrangements/Services Living arrangements for the past 2 months: Single Family Home Lives with:: Self Patient language and need for interpreter reviewed:: Yes Do you feel safe going back to the place where you live?: Yes      Need for Family Participation in Patient Care: Yes (Comment) Care giver support system in place?: Yes (comment)   Criminal Activity/Legal Involvement Pertinent to Current Situation/Hospitalization: No - Comment as needed  Activities of Daily Living Home Assistive Devices/Equipment: None ADL Screening (condition at time of admission) Patient's cognitive ability adequate to safely complete daily activities?: Yes Is the patient deaf or have difficulty hearing?: No Does the patient have difficulty seeing,  even when wearing glasses/contacts?: No Does the patient have difficulty concentrating, remembering, or making decisions?: No Patient able to express need for assistance with ADLs?: Yes Does the patient have difficulty dressing or bathing?: Yes Independently performs ADLs?: Yes (appropriate for developmental age) Does the patient have difficulty walking or climbing stairs?: Yes Weakness of Legs: Both Weakness of Arms/Hands: None  Permission Sought/Granted                  Emotional Assessment Appearance:: Appears stated age Attitude/Demeanor/Rapport: Engaged Affect (typically observed): Accepting Orientation: : Oriented to Self, Oriented to Place, Oriented to  Time, Oriented to Situation   Psych Involvement: No (comment)  Admission diagnosis:  SOB (shortness of breath) [R06.02] Symptomatic anemia [D64.9] Patient Active Problem List   Diagnosis Date Noted  . Symptomatic anemia 08/18/2018  . Hypothyroidism 08/18/2018  . Acute CHF (congestive heart failure) ([place]) 08/18/2018  . Elevated lactic acid level 08/18/2018  . Fluid overload 08/18/2018  . Acute diastolic CHF (congestive heart failure) ([place])    PCP:  Patient, No Pcp Per Pharmacy:   [id number] - [place], [place] - [address] [place] [address] Phone: [phone] Fax: [phone]     Social Determinants of Health (SDOH) Interventions    Readmission Risk Interventions No flowsheet data found.

## 2018-08-21 NOTE — Discharge Summary (Addendum)
Physician Discharge Summary  Gwendolyn Mclean OZY:[id number] DOB: April 03, 1960 DOA: 08/17/2018  PCP: Patient, No Pcp Per  Admit date: 08/17/2018 Discharge date: 08/21/2018  Admitted From: Home  Disposition:  Home   Recommendations for Outpatient Follow-up and new medication changes:  1. Follow up with Primary Care in 7 days.  2. Patient has been placed on furosemide 20 mg daily along with Kcl supplements. Instructed to discontinue daily diuretic once lower extremity edema resolves, then continue only as needed.   Home Health: no   Equipment/Devices: no    Discharge Condition: stable  CODE STATUS: full  Diet recommendation: Heart healthy    Brief/Interim Summary: 58 year old female who presented with dyspnea.  She does have significant past medical history for postmenopausal bleeding, and hypothyroidism.  She reported heavy menstrual bleeding for a few months, having to change pads about every 15 minutes. She developed severe dyspnea, associated lower extremity edema.  On her initial physical examination blood pressure 135/65, heart rate 101, respiratory rate 20-34, temperature 98.5, oxygen saturation 100% on supplemental oxygen.  She had dry mucous membranes, her lungs were clear to auscultation bilaterally, heart S1-S2 present and rhythmic, abdomen was soft and non-tender, positive lower extremity edema.  Sodium 133, potassium 4.1, chloride 106, bicarb 17, glucose 107, BUN 10, creatinine 0.76, troponin 0.03, white count 4.7, hemoglobin 2.0, hematocrit 9.2, d-dimer 2.3.  SARS COVID-19 was negative.  Urinalysis 0-5 white cells, 0-5 red cells, specific gravity 1.004.  Chest radiograph with vascular congestion, small right pleural effusion.  CT chest negative for pulmonary embolism, bilateral groundglass opacities with right-sided pleural effusion.  EKG 102 bpm, normal axis, normal intervals, sinus rhythm, no ST segment or T wave changes, low voltage.   Patient was admitted to the hospital with a working  diagnosis of symptomatic anemia complicated by acute onset of high-output diastolic heart failure.   1.  Acute symptomatic anemia due to acute blood loss, vaginal bleeding.  Patient received 4 units of packed red blood cells with good toleration, no further bleeding was noted.  Further work-up with a pelvic ultrasound showed endometrial complex 12 mm thick, normal, thought the stripe is inadequately visualized in the upper uterine segment.  Asymmetric wall thickening of the anterior uterus either representing a poorly defined anterior wall leiomyoma which extends submucosal or an infiltrative process such as adenomyosis.  Patient will need a follow-up with gynecology.  2.  Symptomatic iron deficiency anemia.  Likely related to chronic blood loss, her serum iron was 8, TIBC 524, transferrin saturation 2 and ferritin of 1.  She received IV iron with good toleration.  Follow-up iron panel as an outpatient.  3.  Acute on chronic high output diastolic heart failure.  Patient received IV furosemide, negative fluid balance was achieved, -8689.3 ml, with significant improvement of her volume status.  Further work-up with echocardiography showed a preserved LV systolic function with ejection fraction 65%.  Patient will continue taking furosemide daily, until edema, resolves, then instructed to take only as needed for edema or volume overload. Will need follow up with primary care in 7 days.   4.  Hypokalemia.  Likely due to diuresis with loop diuretics, it was corrected with potassium chloride. Her discharge K is 3,8 with a preserved renal function, cr at 0.72.   5.  Obesity.  Calculated BMI is 35.5.  6. Hypothyroid. Continue levothyroxine.   Discharge Diagnoses:  Active Problems:   Symptomatic anemia   Hypothyroidism   Acute CHF (congestive heart failure) ([facility])   Elevated lactic  acid level   Fluid overload   Acute diastolic CHF (congestive heart failure) ([facility])    Discharge  Instructions   Allergies as of 08/21/2018   No Known Allergies     Medication List    TAKE these medications   furosemide 20 MG tablet Commonly known as: LASIX Take 1 tablet (20 mg total) by mouth daily for 30 days. Start taking on: August 22, 2018   levothyroxine 100 MCG tablet Commonly known as: SYNTHROID Take 1 tablet (100 mcg total) by mouth daily before breakfast for 30 days.   potassium chloride 10 MEQ tablet Commonly known as: K-DUR Take 1 tablet (10 mEq total) by mouth daily for 30 days.       No Known Allergies  Consultations:  Cardiology    Procedures/Studies: Ct Angio Chest Pe W And/or Wo Contrast  Result Date: 08/17/2018 CLINICAL DATA:  Productive cough. Shortness of breath times several months. EXAM: CT ANGIOGRAPHY CHEST WITH CONTRAST TECHNIQUE: Multidetector CT imaging of the chest was performed using the standard protocol during bolus administration of intravenous contrast. Multiplanar CT image reconstructions and MIPs were obtained to evaluate the vascular anatomy. CONTRAST:  164mL OMNIPAQUE IOHEXOL 350 MG/ML SOLN COMPARISON:  None. FINDINGS: Cardiovascular: The heart is enlarged. There is a small to moderate-sized pericardial effusion. The main pulmonary artery is dilated measuring up to approximately 3.6 cm in diameter. Detection of pulmonary emboli is limited by motion artifact and suboptimal contrast bolus timing. Given this limitation, there is no definite PE. Detection of small segmental and subsegmental pulmonary emboli is not possible on this exam. Mediastinum/Nodes: No enlarged mediastinal, hilar, or axillary lymph nodes. Thyroid gland, trachea, and esophagus demonstrate no significant findings. Lungs/Pleura: There is a moderate-sized right-sided pleural effusion. There is a small left-sided pleural effusion. There is some interlobular septal thickening bilaterally consistent with volume overload. There is atelectasis at the lung bases, right worse than left.  Detection of small pulmonary nodules is limited by significant motion artifact. Upper Abdomen: No acute abnormality. Musculoskeletal: No chest wall abnormality. No acute or significant osseous findings. Review of the MIP images confirms the above findings. IMPRESSION: 1. Limited study secondary to contrast bolus timing and motion artifact. Given these limitations, no PE was identified. 2. Small to moderate-sized pericardial effusion. There is cardiomegaly with findings of volume overload. 3. Moderate right and small left pleural effusions. 4. Bibasilar atelectasis. 5. Dilated main pulmonary artery which can be seen in patients with elevated pulmonary artery pressures. Electronically Signed   By: Constance Holster M.D.   On: 08/17/2018 20:50   Dg Chest Port 1 View  Result Date: 08/17/2018 CLINICAL DATA:  Shortness of breath EXAM: PORTABLE CHEST 1 VIEW COMPARISON:  None. FINDINGS: Small right pleural effusion. No left pleural effusion. No pneumothorax. No focal consolidation. Normal cardiomediastinal silhouette. No aggressive osseous lesion. IMPRESSION: 1. Small right pleural effusion. Electronically Signed   By: Kathreen Devoid   On: 08/17/2018 18:53   US Pelvic Complete With Transvaginal  Result Date: 08/19/2018 CLINICAL DATA:  Menorrhagia EXAM: TRANSABDOMINAL AND TRANSVAGINAL ULTRASOUND OF PELVIS TECHNIQUE: Both transabdominal and transvaginal ultrasound examinations of the pelvis were performed. Transabdominal technique was performed for global imaging of the pelvis including uterus, ovaries, adnexal regions, and pelvic cul-de-sac. It was necessary to proceed with endovaginal exam following the transabdominal exam to visualize the endometrium. COMPARISON:  None FINDINGS: Uterus Measurements: 10.4 x 7.4 x 8.8 cm = volume: Heterogeneous mL. Asymmetric significant anterior wall thickening of the uterus either representing a poorly defined  leiomyoma which cannot be accurately measured or an infiltrative process  such as adenomyosis. No discrete margins are identified. Endometrium Thickness: 12 mm. Small amount of endometrial fluid at the mid to lower uterine segments. Endometrial stripe at the upper uterine segment is not well visualized Right ovary Measurements: 4.3 x 2.0 x 3.6 cm = volume: 16.2 mL. Normal morphology without mass Left ovary Measurements: 3.9 x 1.9 x 4.3 cm = volume: 16.5 mL. Normal morphology without mass Other findings No free pelvic fluid.  No adnexal masses. IMPRESSION: Nonspecific endometrial fluid. Endometrial complex is 12 mm thick, normal, though the stripe is inadequately visualized in the upper uterine segment. Asymmetric wall thickening of the anterior uterus either representing a poorly defined anterior wall leiomyoma which extends submucosal or an infiltrative process such as adenomyosis. Electronically Signed   By: Lavonia Dana M.D.   On: 08/19/2018 10:15      Procedures:   Subjective: Patient is feeling better, dyspnea has resolved and lower extremity edema continue to improve, no chest pain, no nausea or vomiting.   Discharge Exam: Vitals:   08/20/18 2130 08/21/18 0526  BP: (!) 112/59 (!) 100/45  Pulse: 87 90  Resp: 20 20  Temp: 99.1 F (37.3 C) 99.5 F (37.5 C)  SpO2: 96% 95%   Vitals:   08/20/18 0535 08/20/18 1336 08/20/18 2130 08/21/18 0526  BP: (!) 99/55 111/72 (!) 112/59 (!) 100/45  Pulse: 80 81 87 90  Resp:  20 20 20   Temp: 98.7 F (37.1 C) 98 F (36.7 C) 99.1 F (37.3 C) 99.5 F (37.5 C)  TempSrc: Oral Oral Oral Oral  SpO2: 95% 100% 96% 95%  Weight:    86.4 kg  Height:        General: Not in pain or dyspnea.  Neurology: Awake and alert, non focal  E ENT: no pallor, no icterus, oral mucosa moist Cardiovascular: No JVD. S1-S2 present, rhythmic, no gallops, rubs, positive systolic murmur at the apex 2/6. +/++ pitting lower extremity edema. Pulmonary: positive breath sounds bilaterally, no wheezing, rhonchi or rales. Gastrointestinal. Abdomen with  no organomegaly, non tender, no rebound or guarding Skin. No rashes Musculoskeletal: no joint deformities   The results of significant diagnostics from this hospitalization (including imaging, microbiology, ancillary and laboratory) are listed below for reference.     Microbiology: Recent Results (from the past 240 hour(s))  SARS Coronavirus 2 (CEPHEID - Performed in [facility] hospital lab), Hosp Order     Status: None   Collection Time: 08/17/18 11:38 PM   Specimen: Nasopharyngeal Swab  Result Value Ref Range Status   SARS Coronavirus 2 NEGATIVE NEGATIVE Final    Comment: (NOTE) If result is NEGATIVE SARS-CoV-2 target nucleic acids are NOT DETECTED. The SARS-CoV-2 RNA is generally detectable in upper and lower  respiratory specimens during the acute phase of infection. The lowest  concentration of SARS-CoV-2 viral copies this assay can detect is 250  copies / mL. A negative result does not preclude SARS-CoV-2 infection  and should not be used as the sole basis for treatment or other  patient management decisions.  A negative result may occur with  improper specimen collection / handling, submission of specimen other  than nasopharyngeal swab, presence of viral mutation(s) within the  areas targeted by this assay, and inadequate number of viral copies  (<250 copies / mL). A negative result must be combined with clinical  observations, patient history, and epidemiological information. If result is POSITIVE SARS-CoV-2 target nucleic acids are DETECTED.  The SARS-CoV-2 RNA is generally detectable in upper and lower  respiratory specimens dur ing the acute phase of infection.  Positive  results are indicative of active infection with SARS-CoV-2.  Clinical  correlation with patient history and other diagnostic information is  necessary to determine patient infection status.  Positive results do  not rule out bacterial infection or co-infection with other viruses. If result is  PRESUMPTIVE POSTIVE SARS-CoV-2 nucleic acids MAY BE PRESENT.   A presumptive positive result was obtained on the submitted specimen  and confirmed on repeat testing.  While 2019 novel coronavirus  (SARS-CoV-2) nucleic acids may be present in the submitted sample  additional confirmatory testing may be necessary for epidemiological  and / or clinical management purposes  to differentiate between  SARS-CoV-2 and other Sarbecovirus currently known to infect humans.  If clinically indicated additional testing with an alternate test  methodology [phone]) is advised. The SARS-CoV-2 RNA is generally  detectable in upper and lower respiratory sp ecimens during the acute  phase of infection. The expected result is Negative. Fact Sheet for Patients:  StrictlyIdeas.no Fact Sheet for Healthcare Providers: BankingDealers.co.za This test is not yet approved or cleared by the [place] FDA and has been authorized for detection and/or diagnosis of SARS-CoV-2 by FDA under an Emergency Use Authorization (EUA).  This EUA will remain in effect (meaning this test can be used) for the duration of the COVID-19 declaration under Section 564(b)(1) of the Act, 21 U.S.C. section 360bbb-3(b)(1), unless the authorization is terminated or revoked sooner. Performed at [facility], [place] [address] [place], [place] [address]   MRSA PCR Screening     Status: None   Collection Time: 08/18/18  2:00 AM   Specimen: Nasal Mucosa; Nasopharyngeal  Result Value Ref Range Status   MRSA by PCR NEGATIVE NEGATIVE Final    Comment:        The GeneXpert MRSA Assay (FDA approved for NASAL specimens only), is one component of a comprehensive MRSA colonization surveillance program. It is not intended to diagnose MRSA infection nor to guide or monitor treatment for MRSA infections. Performed at [facility], [place] [address]  [place],  [address]      Labs: BNP (last 3 results) Recent Labs    08/17/18 2252  BNP 009.2*   Basic Metabolic Panel: Recent Labs  Lab 08/17/18 1714 08/18/18 1339 08/19/18 0247 08/20/18 0532 08/21/18 0512  NA 133* 136 137 139 140  K 4.1 3.2* 3.1* 3.8 3.8  CL 106 105 106 106 111  CO2 17* 21* 22 23 18*  GLUCOSE 107* 125* 101* 91 93  BUN 10 10 9 9 9   CREATININE 0.76 0.65 0.63 0.57 0.72  CALCIUM 8.6* 8.2* 8.1* 8.6* 8.9  MG  --  2.3 2.0 2.1 2.1  PHOS  --  2.3*  --   --   --    Liver Function Tests: Recent Labs  Lab 08/18/18 1339 08/19/18 0247  AST 26 29  ALT 22 20  ALKPHOS 44 39  BILITOT 3.7* 2.7*  PROT 6.7 6.1*  ALBUMIN 3.8 3.4*   No results for input(s): LIPASE, AMYLASE in the last 168 hours. No results for input(s): AMMONIA in the last 168 hours. CBC: Recent Labs  Lab 08/17/18 2252 08/18/18 1341 08/18/18 2211 08/19/18 0247 08/19/18 1116 08/20/18 0532 08/21/18 0512  WBC 4.7 5.6  --  5.6  --  7.1 8.1  NEUTROABS  --  4.3  --  3.6  --   --   --  HGB 2.0* 5.7* 7.1* 6.5* 9.3* 8.9* 9.0*  HCT 9.2* 19.0* 23.2* 21.1* 29.1* 28.5* 31.0*  MCV 63.9* 73.9*  --  74.8*  --  78.7* 84.2  PLT 218 216  --  187  --  166 155   Cardiac Enzymes: Recent Labs  Lab 08/17/18 1812  TROPONINI <0.03   BNP: Invalid input(s): POCBNP CBG: No results for input(s): GLUCAP in the last 168 hours. D-Dimer No results for input(s): DDIMER in the last 72 hours. Hgb A1c No results for input(s): HGBA1C in the last 72 hours. Lipid Profile No results for input(s): CHOL, HDL, LDLCALC, TRIG, CHOLHDL, LDLDIRECT in the last 72 hours. Thyroid function studies Recent Labs    08/18/18 1340  TSH 5.026*   Anemia work up No results for input(s): VITAMINB12, FOLATE, FERRITIN, TIBC, IRON, RETICCTPCT in the last 72 hours. Urinalysis    Component Value Date/Time   COLORURINE YELLOW 08/19/2018 0300   APPEARANCEUR CLEAR 08/19/2018 0300   LABSPEC 1.004 (L) 08/19/2018 0300   PHURINE 6.0  08/19/2018 0300   GLUCOSEU NEGATIVE 08/19/2018 0300   HGBUR NEGATIVE 08/19/2018 0300   BILIRUBINUR NEGATIVE 08/19/2018 0300   KETONESUR NEGATIVE 08/19/2018 0300   PROTEINUR NEGATIVE 08/19/2018 0300   NITRITE NEGATIVE 08/19/2018 0300   LEUKOCYTESUR NEGATIVE 08/19/2018 0300   Sepsis Labs Invalid input(s): PROCALCITONIN,  WBC,  LACTICIDVEN Microbiology Recent Results (from the past 240 hour(s))  SARS Coronavirus 2 (CEPHEID - Performed in [facility] hospital lab), Hosp Order     Status: None   Collection Time: 08/17/18 11:38 PM   Specimen: Nasopharyngeal Swab  Result Value Ref Range Status   SARS Coronavirus 2 NEGATIVE NEGATIVE Final    Comment: (NOTE) If result is NEGATIVE SARS-CoV-2 target nucleic acids are NOT DETECTED. The SARS-CoV-2 RNA is generally detectable in upper and lower  respiratory specimens during the acute phase of infection. The lowest  concentration of SARS-CoV-2 viral copies this assay can detect is 250  copies / mL. A negative result does not preclude SARS-CoV-2 infection  and should not be used as the sole basis for treatment or other  patient management decisions.  A negative result may occur with  improper specimen collection / handling, submission of specimen other  than nasopharyngeal swab, presence of viral mutation(s) within the  areas targeted by this assay, and inadequate number of viral copies  (<250 copies / mL). A negative result must be combined with clinical  observations, patient history, and epidemiological information. If result is POSITIVE SARS-CoV-2 target nucleic acids are DETECTED. The SARS-CoV-2 RNA is generally detectable in upper and lower  respiratory specimens dur ing the acute phase of infection.  Positive  results are indicative of active infection with SARS-CoV-2.  Clinical  correlation with patient history and other diagnostic information is  necessary to determine patient infection status.  Positive results do  not rule out  bacterial infection or co-infection with other viruses. If result is PRESUMPTIVE POSTIVE SARS-CoV-2 nucleic acids MAY BE PRESENT.   A presumptive positive result was obtained on the submitted specimen  and confirmed on repeat testing.  While 2019 novel coronavirus  (SARS-CoV-2) nucleic acids may be present in the submitted sample  additional confirmatory testing may be necessary for epidemiological  and / or clinical management purposes  to differentiate between  SARS-CoV-2 and other Sarbecovirus currently known to infect humans.  If clinically indicated additional testing with an alternate test  methodology [phone]) is advised. The SARS-CoV-2 RNA is generally  detectable in upper and  lower respiratory sp ecimens during the acute  phase of infection. The expected result is Negative. Fact Sheet for Patients:  StrictlyIdeas.no Fact Sheet for Healthcare Providers: BankingDealers.co.za This test is not yet approved or cleared by the [place] FDA and has been authorized for detection and/or diagnosis of SARS-CoV-2 by FDA under an Emergency Use Authorization (EUA).  This EUA will remain in effect (meaning this test can be used) for the duration of the COVID-19 declaration under Section 564(b)(1) of the Act, 21 U.S.C. section 360bbb-3(b)(1), unless the authorization is terminated or revoked sooner. Performed at [facility], [place] [address] [place], [place] [address]   MRSA PCR Screening     Status: None   Collection Time: 08/18/18  2:00 AM   Specimen: Nasal Mucosa; Nasopharyngeal  Result Value Ref Range Status   MRSA by PCR NEGATIVE NEGATIVE Final    Comment:        The GeneXpert MRSA Assay (FDA approved for NASAL specimens only), is one component of a comprehensive MRSA colonization surveillance program. It is not intended to diagnose MRSA infection nor to guide or monitor treatment for MRSA  infections. Performed at [facility], [place] [address], [place], [place] [address]      Time coordinating discharge: 45 minutes  SIGNED:   Tawni Millers, MD  [facility] 08/21/2018, 9:49 AM

## 2018-08-21 NOTE — Progress Notes (Signed)
Progress Note  Patient Name: Gwendolyn Mclean Date of Encounter: 08/21/2018  Primary Cardiologist:   Fransico Him, MD   Subjective   She feels OK.  No acute SOB.  No chest pain.  Inpatient Medications    Scheduled Meds: . B-complex with vitamin C  1 tablet Oral Daily  . Chlorhexidine Gluconate Cloth  6 each Topical Daily  . ferrous sulfate  325 mg Oral BID WC  . furosemide  40 mg Oral Daily  . levothyroxine  100 mcg Oral QAC breakfast  . Living Better with Heart Failure Book   Does not apply Once  . mouth rinse  15 mL Mouth Rinse BID  . potassium chloride  20 mEq Oral BID  . senna-docusate  1 tablet Oral BID  . sodium chloride flush  3 mL Intravenous Q12H   Continuous Infusions: . sodium chloride     PRN Meds: sodium chloride, acetaminophen **OR** acetaminophen, benzonatate, HYDROcodone-acetaminophen, ondansetron **OR** ondansetron (ZOFRAN) IV, sodium chloride flush   Vital Signs    Vitals:   08/20/18 0535 08/20/18 1336 08/20/18 2130 08/21/18 0526  BP: (!) 99/55 111/72 (!) 112/59 (!) 100/45  Pulse: 80 81 87 90  Resp:  20 20 20   Temp: 98.7 F (37.1 C) 98 F (36.7 C) 99.1 F (37.3 C) 99.5 F (37.5 C)  TempSrc: Oral Oral Oral Oral  SpO2: 95% 100% 96% 95%  Weight:    86.4 kg  Height:        Intake/Output Summary (Last 24 hours) at 08/21/2018 0742 Last data filed at 08/21/2018 0531 Gross per 24 hour  Intake 780 ml  Output 2350 ml  Net -1570 ml   Filed Weights   08/19/18 0500 08/19/18 0947 08/21/18 0526  Weight: 85.7 kg 91.1 kg 86.4 kg    Telemetry     NA - Personally Reviewed  ECG    NA - Personally Reviewed  Physical Exam   GEN: No acute distress.   Neck: No  JVD Cardiac: RRR, apical systolic murmur, no diastolic murmurs, rubs, or gallops.  Respiratory: Clear  to auscultation bilaterally. GI: Soft, nontender, non-distended  MS:   Mild leg edema; No deformity. Neuro:  Nonfocal  Psych: Normal affect   Labs    Chemistry Recent Labs  Lab 08/18/18  1339 08/19/18 0247 08/20/18 0532  NA 136 137 139  K 3.2* 3.1* 3.8  CL 105 106 106  CO2 21* 22 23  GLUCOSE 125* 101* 91  BUN 10 9 9   CREATININE 0.65 0.63 0.57  CALCIUM 8.2* 8.1* 8.6*  PROT 6.7 6.1*  --   ALBUMIN 3.8 3.4*  --   AST 26 29  --   ALT 22 20  --   ALKPHOS 44 39  --   BILITOT 3.7* 2.7*  --   GFRNONAA >60 >60 >60  GFRAA >60 >60 >60  ANIONGAP 10 9 10      Hematology Recent Labs  Lab 08/19/18 0247 08/19/18 1116 08/20/18 0532 08/21/18 0512  WBC 5.6  --  7.1 8.1  RBC 2.82*  --  3.62* 3.68*  HGB 6.5* 9.3* 8.9* 9.0*  HCT 21.1* 29.1* 28.5* 31.0*  MCV 74.8*  --  78.7* 84.2  MCH 23.0*  --  24.6* 24.5*  MCHC 30.8  --  31.2 29.0*  RDW 24.5*  --  24.6* 26.7*  PLT 187  --  166 155    Cardiac Enzymes Recent Labs  Lab 08/17/18 1812  TROPONINI <0.03   No results for input(s): TROPIPOC  in the last 168 hours.   BNP Recent Labs  Lab 08/17/18 2252  BNP 451.0*     DDimer  Recent Labs  Lab 08/17/18 1812  DDIMER 2.31*     Radiology    US Pelvic Complete With Transvaginal  Result Date: 08/19/2018 CLINICAL DATA:  Menorrhagia EXAM: TRANSABDOMINAL AND TRANSVAGINAL ULTRASOUND OF PELVIS TECHNIQUE: Both transabdominal and transvaginal ultrasound examinations of the pelvis were performed. Transabdominal technique was performed for global imaging of the pelvis including uterus, ovaries, adnexal regions, and pelvic cul-de-sac. It was necessary to proceed with endovaginal exam following the transabdominal exam to visualize the endometrium. COMPARISON:  None FINDINGS: Uterus Measurements: 10.4 x 7.4 x 8.8 cm = volume: Heterogeneous mL. Asymmetric significant anterior wall thickening of the uterus either representing a poorly defined leiomyoma which cannot be accurately measured or an infiltrative process such as adenomyosis. No discrete margins are identified. Endometrium Thickness: 12 mm. Small amount of endometrial fluid at the mid to lower uterine segments. Endometrial stripe  at the upper uterine segment is not well visualized Right ovary Measurements: 4.3 x 2.0 x 3.6 cm = volume: 16.2 mL. Normal morphology without mass Left ovary Measurements: 3.9 x 1.9 x 4.3 cm = volume: 16.5 mL. Normal morphology without mass Other findings No free pelvic fluid.  No adnexal masses. IMPRESSION: Nonspecific endometrial fluid. Endometrial complex is 12 mm thick, normal, though the stripe is inadequately visualized in the upper uterine segment. Asymmetric wall thickening of the anterior uterus either representing a poorly defined anterior wall leiomyoma which extends submucosal or an infiltrative process such as adenomyosis. Electronically Signed   By: Lavonia Dana M.D.   On: 08/19/2018 10:15    Cardiac Studies   ECHO:     1. The left ventricle has hyperdynamic systolic function, with an ejection fraction of >65%. The cavity size was normal. There is mildly increased left ventricular wall thickness. Left ventricular diastolic Doppler parameters are consistent with  impaired relaxation. No evidence of left ventricular regional wall motion abnormalities.  2. Mild increase in LVOT velocity (2.34m/s) secondary to hyperdynamic function.  3. The right ventricle has normal systolic function. The cavity was normal. There is no increase in right ventricular wall thickness.  4. The aortic valve was not well visualized.  Patient Profile     58 y.o. female with a hx of hypothyroidism and obesity but no cardiac hx who was admitted with a 4 months hx of DOE associated with vaginal bleeding for about the same amount of time. She has not had any CP or palpitations but started having LE edema several weeks ago. She saw her PCP and was noted to have a Hbg of 2, HCT 9.2 and Ferritin of 1. She was admitted to the hospital and Chest CT showed no PE but small to moderate pericardial effusion with CM, moderate right pleural effusion c/w CHF  Assessment & Plan     ACUTE DIASTOLIC HF:  Likely high output.  NL  EF as above.   I/O negative 6.7 liters since admission.   Changed to PO Lasix yesterday.  I think that it is OK for her to go home.  I would suggest 20 mg of Lasix PO for the next couple of days.  She could then take this PRN.  Send home with 10 meq PO KDur daily when taking the Lasix.  I don't think she will need potassium or Lasix long term. We talked about salt and fluid restriction.     ANEMIA :  Per primary team.    PERICARDIAL EFFUSION:   None on echo.  No further work up. Hgb up to 9.0.    For questions or updates, please contact [place] Please consult [email or web address] for contact info under Cardiology/STEMI.   Signed, Minus Breeding, MD  08/21/2018, 7:42 AM

## 2018-08-31 ENCOUNTER — Other Ambulatory Visit: Payer: Self-pay

## 2018-08-31 ENCOUNTER — Encounter: Payer: Self-pay | Admitting: Family Medicine

## 2018-08-31 ENCOUNTER — Ambulatory Visit: Payer: PRIVATE HEALTH INSURANCE | Admitting: Family Medicine

## 2018-08-31 VITALS — BP 121/77 | HR 79 | Temp 98.2°F | Resp 18 | Ht 63.0 in | Wt 174.0 lb

## 2018-08-31 DIAGNOSIS — D649 Anemia, unspecified: Secondary | ICD-10-CM | POA: Diagnosis not present

## 2018-08-31 DIAGNOSIS — N950 Postmenopausal bleeding: Secondary | ICD-10-CM | POA: Diagnosis not present

## 2018-08-31 DIAGNOSIS — E039 Hypothyroidism, unspecified: Secondary | ICD-10-CM | POA: Diagnosis not present

## 2018-08-31 LAB — CBC
HCT: 38.9 % (ref 36.0–46.0)
Hemoglobin: 12.4 g/dL (ref 12.0–15.0)
MCHC: 31.9 g/dL (ref 30.0–36.0)
MCV: 84.2 fl (ref 78.0–100.0)
Platelets: 416 10*3/uL — ABNORMAL HIGH (ref 150.0–400.0)
RBC: 4.62 Mil/uL (ref 3.87–5.11)
RDW: 30.3 % — ABNORMAL HIGH (ref 11.5–15.5)
WBC: 4.3 10*3/uL (ref 4.0–10.5)

## 2018-08-31 LAB — BASIC METABOLIC PANEL
BUN: 16 mg/dL (ref 6–23)
CO2: 24 mEq/L (ref 19–32)
Calcium: 10 mg/dL (ref 8.4–10.5)
Chloride: 105 mEq/L (ref 96–112)
Creatinine, Ser: 0.71 mg/dL (ref 0.40–1.20)
GFR: 85 mL/min (ref >60.00)
Glucose, Bld: 90 mg/dL (ref 70–99)
Potassium: 4.8 mEq/L (ref 3.5–5.1)
Sodium: 141 mEq/L (ref 135–145)

## 2018-08-31 LAB — TSH: TSH: 7.05 u[IU]/mL — ABNORMAL HIGH (ref 0.35–4.50)

## 2018-08-31 LAB — T4, FREE: Free T4: 0.7 ng/dL (ref 0.60–1.60)

## 2018-08-31 NOTE — Patient Instructions (Addendum)
Call Center for [place] at [facility] at [phone] for an appointment.  They are located at [address], [place], [place], [address] (right across the [place] from our office).  Give [place] 2-3 business days to get the results of your labs back.   Stop Lasix and potassium for now.   Consider ferrous sulfate (over the counter iron) up to 3 times daily for the next several weeks.  Let us know if you need anything.

## 2018-08-31 NOTE — Progress Notes (Signed)
Chief Complaint  Patient presents with  . New Patient (Initial Visit)    Pt states f/u from a hospital stay and was told to f/u on blood work. Pt states having a PCP prior but hasn't had a steady PCP.   Marland Kitchen Anemia    Pt states getting blood transfusions, 6 or 7 per pt.  Pt states feeling better but having some weakness in the legs       New Patient Visit SUBJECTIVE: HPI: Gwendolyn Mclean is an 58 y.o.female who is being seen for establishing care.  The patient has not previously had PCP.  Patient was admitted to [facility] on 08/17/2018 for symptomatic anemia and vaginal bleeding.  She had gone the past year with irregular menses recently started having heavy vaginal flow.  She received 4 units of blood cells while inpatient.  She was started on Lasix with supplemental potassium due to lower extremity edema.  This has essentially resolved.  She has lost almost 10 pounds since being discharged.  She does not have a gynecologist in the area. Transvag US showed endo stripe of 12 mm.   No Known Allergies  Past Medical History:  Diagnosis Date  . Hypothyroidism   . Obesity   . Perimenopausal    History reviewed. No pertinent surgical history. Family History  Problem Relation Age of Onset  . Hypertension Mother   . CAD Mother   . Diabetes Neg Hx   . Cancer Neg Hx    No Known Allergies  Current Outpatient Medications:  .  levothyroxine (SYNTHROID) 100 MCG tablet, Take 1 tablet (100 mcg total) by mouth daily before breakfast for 30 days., Disp: 30 tablet, Rfl: 0  No LMP recorded. Patient is perimenopausal.  ROS Cardiovascular: Denies LE swelling  GU: Denies current vag bleeding   OBJECTIVE: BP 121/77 (BP Location: Left Arm, Patient Position: Sitting, Cuff Size: Normal)   Pulse 79   Temp 98.2 F (36.8 C) (Oral)   Resp 18   Ht 5\' 3"  (1.6 m)   Wt 174 lb (78.9 kg)   SpO2 100%   BMI 30.82 kg/m   Constitutional: -  VS reviewed -  Well developed, well nourished, appears  stated age -  No apparent distress  Psychiatric: -  Oriented to person, place, and time -  Memory intact -  Affect and mood normal -  Fluent conversation, good eye contact -  Judgment and insight age appropriate  Eye: -  Conjunctivae clear, no discharge -  Pupils symmetric, round, reactive to light  ENMT: -  MMM    Pharynx moist, no exudate, no erythema  Neck: -  No gross swelling, no palpable masses -  Thyroid midline, not enlarged, mobile, no palpable masses  Cardiovascular: -  RRR -  No bruits -  No LE edema  Respiratory: -  Normal respiratory effort, no accessory muscle use, no retraction -  Breath sounds equal, no wheezes, no ronchi, no crackles  Gastrointestinal: -  Bowel sounds normal -  No tenderness, no distention, no guarding, no masses  Musculoskeletal: -  No clubbing, no cyanosis -  Gait normal  Skin: -  No significant lesion on inspection -  Warm and dry to palpation   ASSESSMENT/PLAN: Symptomatic anemia - Plan: CBC, Basic metabolic panel  Post-menopausal bleeding  Hypothyroidism, unspecified type - Plan: T4, free, TSH  F/u on labs. [facility] info across [place] given. Told to call today or tomorrow.  Cont Sythroid.  Patient should return in 61  mo for CPE or prn. The patient voiced understanding and agreement to the plan.   [place], DO 08/31/18  11:07 AM

## 2018-09-05 ENCOUNTER — Telehealth: Payer: Self-pay | Admitting: Family Medicine

## 2018-09-05 NOTE — Telephone Encounter (Signed)
Copied from [place] [id number]. Topic: General - Other >> Sep 05, 2018  9:30 AM Leward Quan A wrote: Reason for CRM: Patient returned a call to Rodger Giangregorio,Ph# [phone] See result note///done already.

## 2018-09-15 ENCOUNTER — Other Ambulatory Visit: Payer: Self-pay | Admitting: Family Medicine

## 2018-09-15 MED ORDER — LEVOTHYROXINE SODIUM 100 MCG PO TABS: 100.0000 ug | ORAL_TABLET | Freq: Every day | ORAL | 1 refills | Status: DC

## 2018-09-15 NOTE — Telephone Encounter (Signed)
Rx sent 

## 2018-09-15 NOTE — Telephone Encounter (Signed)
Medication Refill - Medication: levothyroxine (SYNTHROID) 100 MCG tablet   Has the patient contacted their pharmacy? Yes.   (Agent: If no, request that the patient contact the pharmacy for the refill.) (Agent: If yes, when and what did the pharmacy advise?)  Preferred Pharmacy (with phone number or street name):  [id number] - [place], [place] - [address] AT [place] [phone] (Phone) [phone] (Fax)     Agent: Please be advised that RX refills may take up to 3 business days. We ask that you follow-up with your pharmacy.

## 2019-02-27 ENCOUNTER — Other Ambulatory Visit: Payer: Self-pay | Admitting: Family Medicine

## 2019-02-27 MED ORDER — LEVOTHYROXINE SODIUM 100 MCG PO TABS: 100.0000 ug | ORAL_TABLET | Freq: Every day | ORAL | 1 refills | Status: DC

## 2019-03-28 ENCOUNTER — Encounter: Payer: PRIVATE HEALTH INSURANCE | Admitting: Family Medicine

## 2019-08-27 ENCOUNTER — Other Ambulatory Visit: Payer: Self-pay | Admitting: Family Medicine

## 2020-03-09 ENCOUNTER — Other Ambulatory Visit: Payer: Self-pay | Admitting: Family Medicine

## 2021-04-22 IMAGING — CT CT ANGIOGRAPHY CHEST
2 of 6 series · 18 of 46 positions shown · IV contrast (omnipaque)
Comparison: None.

CLINICAL DATA: Productive cough. Shortness of breath times several
months.

EXAM:
CT ANGIOGRAPHY CHEST WITH CONTRAST
TECHNIQUE: Multidetector CT imaging of the chest was performed using the
standard protocol during bolus administration of intravenous
contrast. Multiplanar CT image reconstructions and MIPs were
obtained to evaluate the vascular anatomy.
CONTRAST:  100mL OMNIPAQUE IOHEXOL 350 MG/ML SOLN

[Series 5: thins · axial · 0.72mm/px · z∈[+1447,+1720]mm · 15 of 299 slices shown]
[im 13/299  lung]
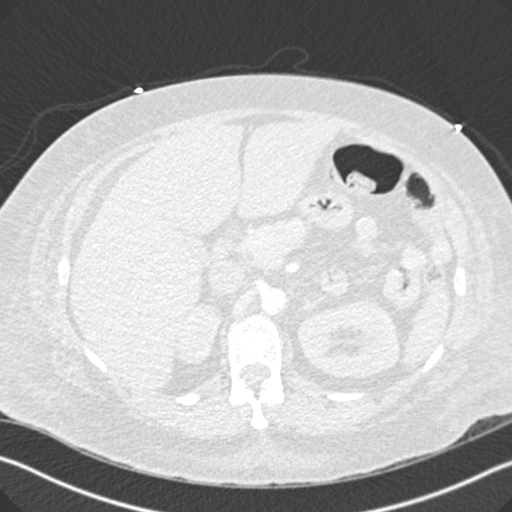
[im 39/299  soft-tissue]
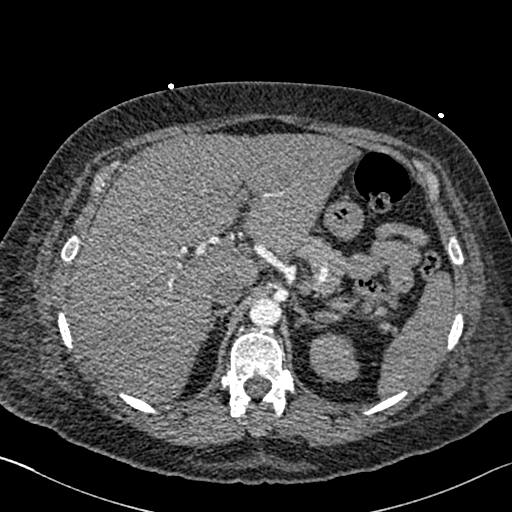
[im 52/299  lung]
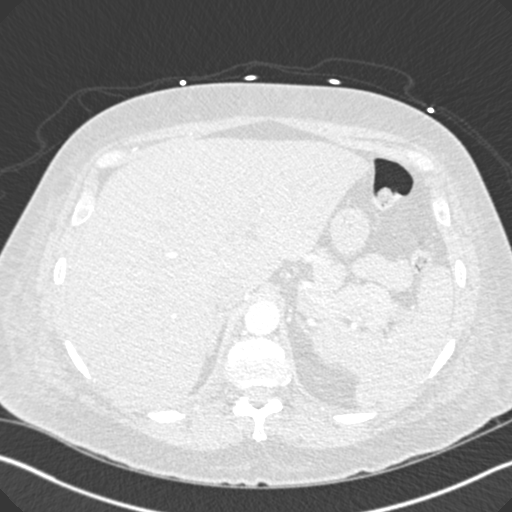
[im 78/299  soft-tissue]
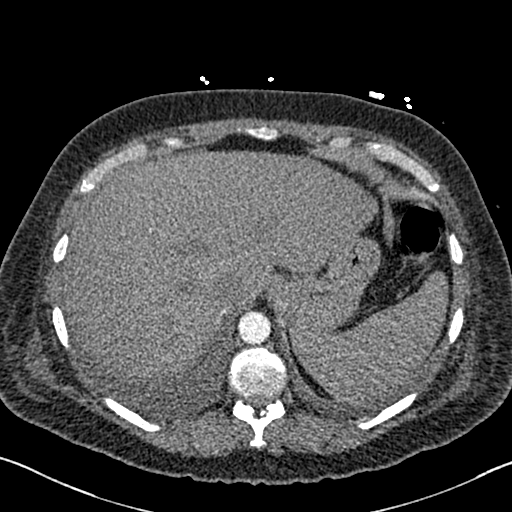
[im 91/299  lung]
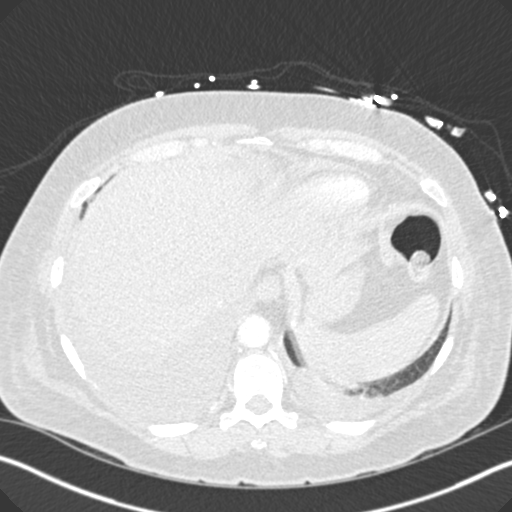
[im 117/299  soft-tissue]
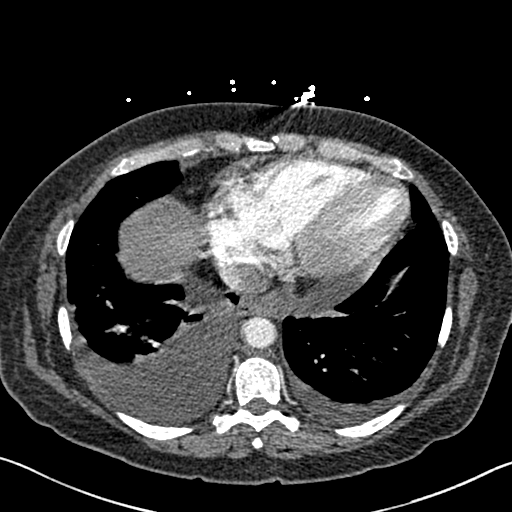
[im 130/299  lung]
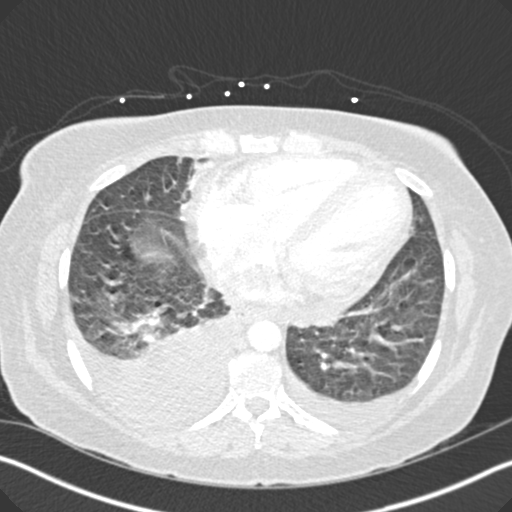
[im 156/299  soft-tissue]
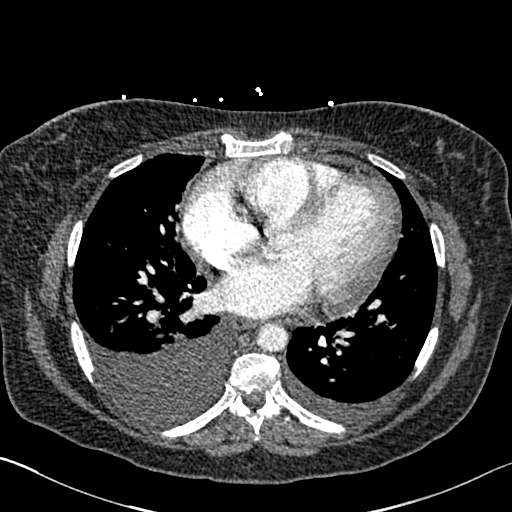
[im 169/299  lung]
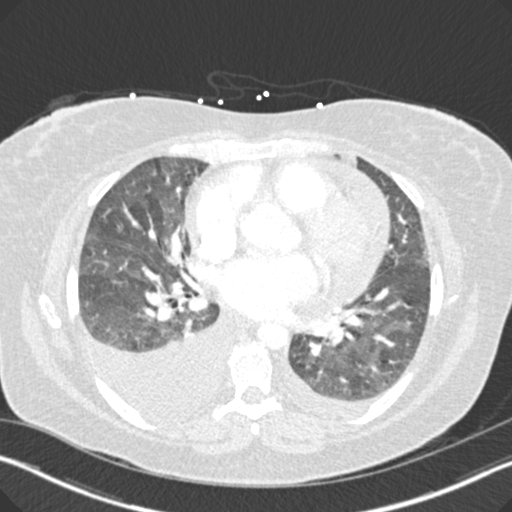
[im 182/299  soft-tissue]
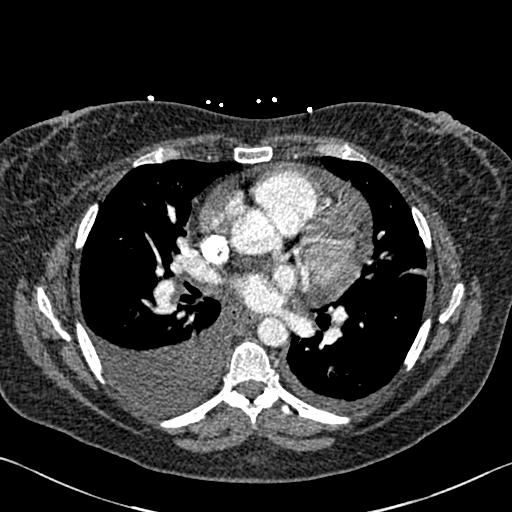
[im 208/299  lung]
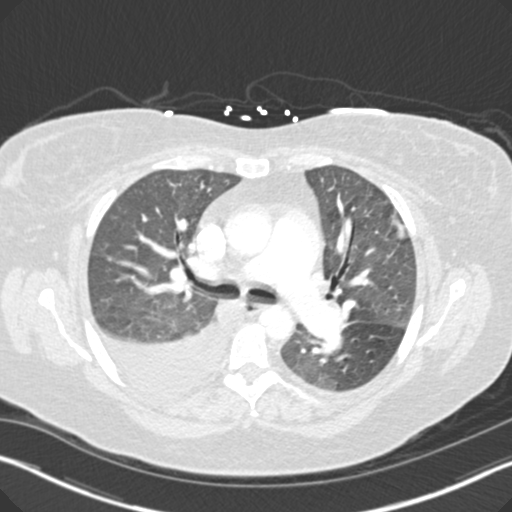
[im 221/299  soft-tissue]
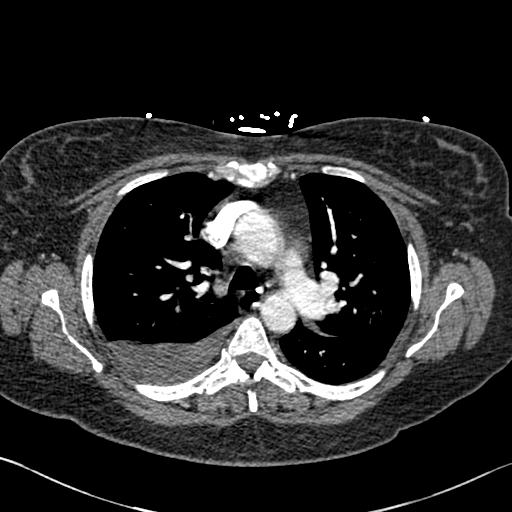
[im 247/299  lung]
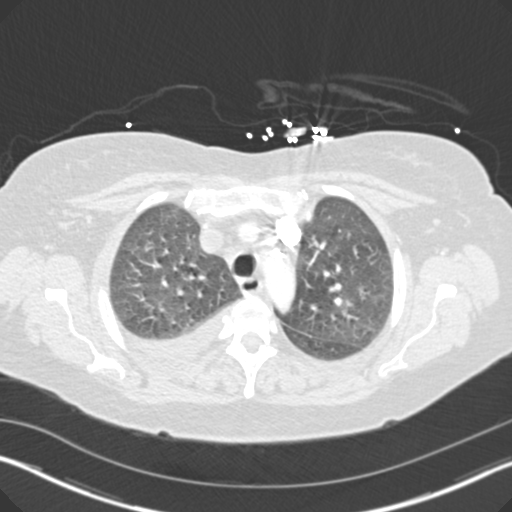
[im 260/299  soft-tissue]
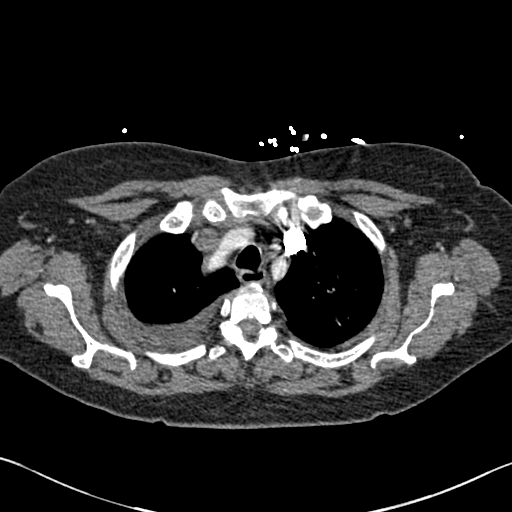
[im 286/299  lung]
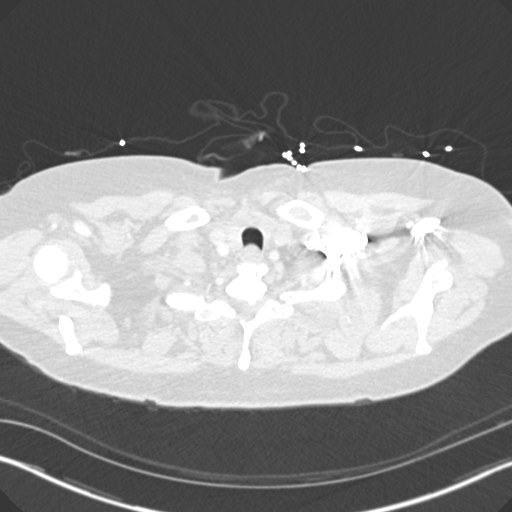

[Series 7: coronal mpr · coronal · 0.61mm/px · 3 of 145 slices shown]
[im 37/145  soft-tissue]
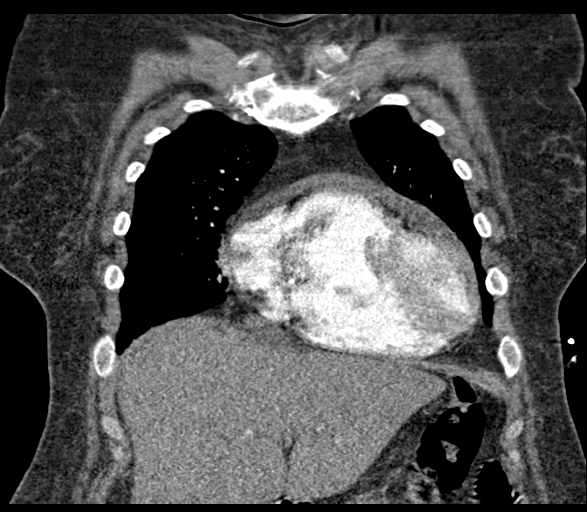
[im 73/145  soft-tissue]
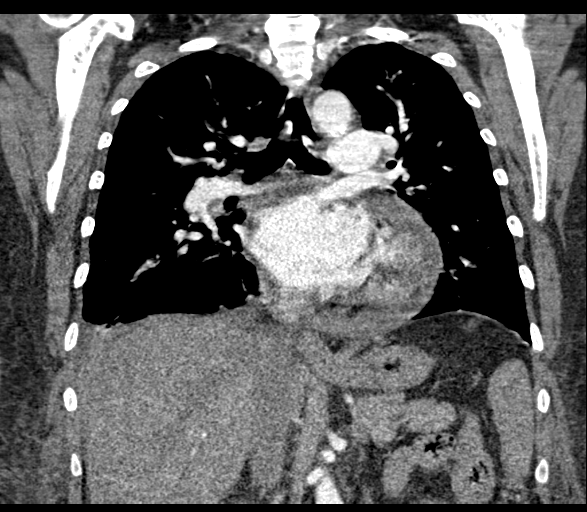
[im 109/145  soft-tissue]
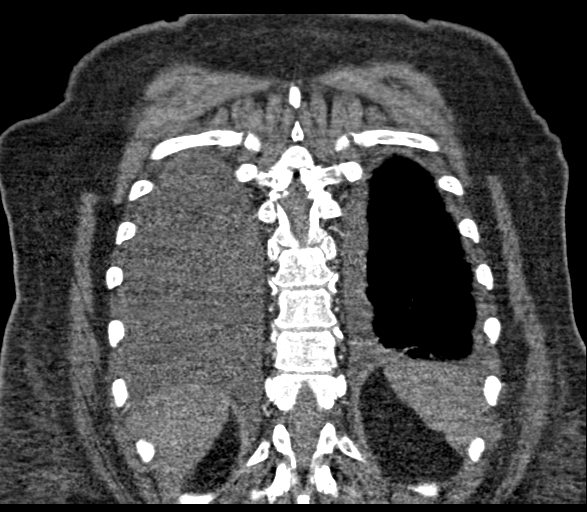

[18 of 46 positions shown; findings below may reference images not displayed]

FINDINGS: Cardiovascular: The heart is enlarged. There is a small to
moderate-sized pericardial effusion. The main pulmonary artery is
dilated measuring up to approximately 3.6 cm in diameter. Detection
of pulmonary emboli is limited by motion artifact and suboptimal
contrast bolus timing. Given this limitation, there is no definite
PE. Detection of small segmental and subsegmental pulmonary emboli
is not possible on this exam.

Mediastinum/Nodes: No enlarged mediastinal, hilar, or axillary lymph
nodes. Thyroid gland, trachea, and esophagus demonstrate no
significant findings.

Lungs/Pleura: There is a moderate-sized right-sided pleural
effusion. There is a small left-sided pleural effusion. There is
some interlobular septal thickening bilaterally consistent with
volume overload. There is atelectasis at the lung bases, right worse
than left. Detection of small pulmonary nodules is limited by
significant motion artifact.

Upper Abdomen: No acute abnormality.

Musculoskeletal: No chest wall abnormality. No acute or significant
osseous findings.

Review of the MIP images confirms the above findings.
IMPRESSION: 1. Limited study secondary to contrast bolus timing and motion
artifact. Given these limitations, no PE was identified.
2. Small to moderate-sized pericardial effusion. There is
cardiomegaly with findings of volume overload.
3. Moderate right and small left pleural effusions.
4. Bibasilar atelectasis.
5. Dilated main pulmonary artery which can be seen in patients with
elevated pulmonary artery pressures.

## 2021-04-24 IMAGING — US US PELVIS COMPLETE WITH TRANSVAGINAL
1 series · 13 of 25 positions shown · non-contrast
Comparison: None

CLINICAL DATA: Menorrhagia



[Series 1: us pelvis complete with transvaginal · 13 of 82 slices shown]
[im 1/82]
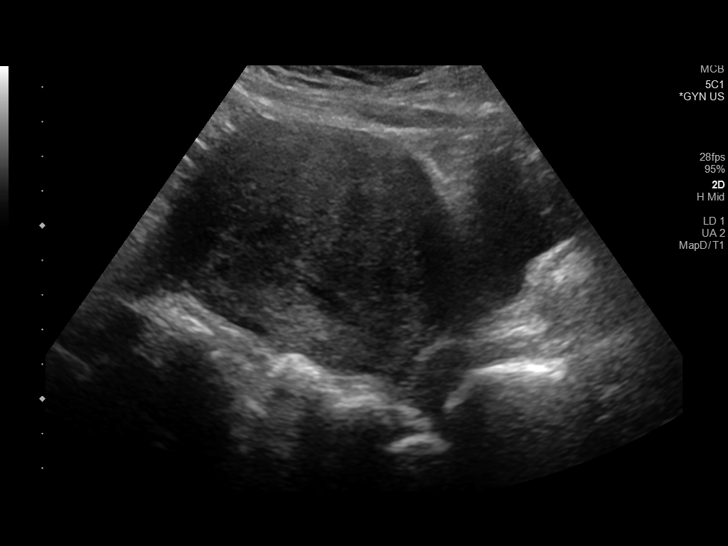
[im 7/82]
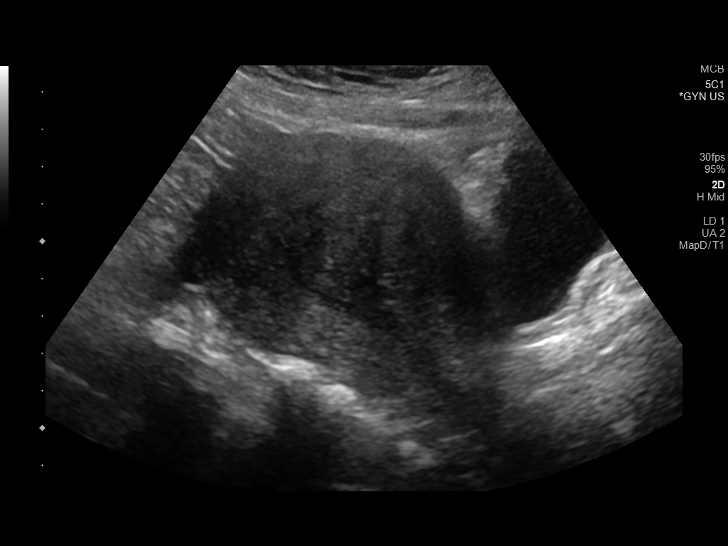
[im 14/82]
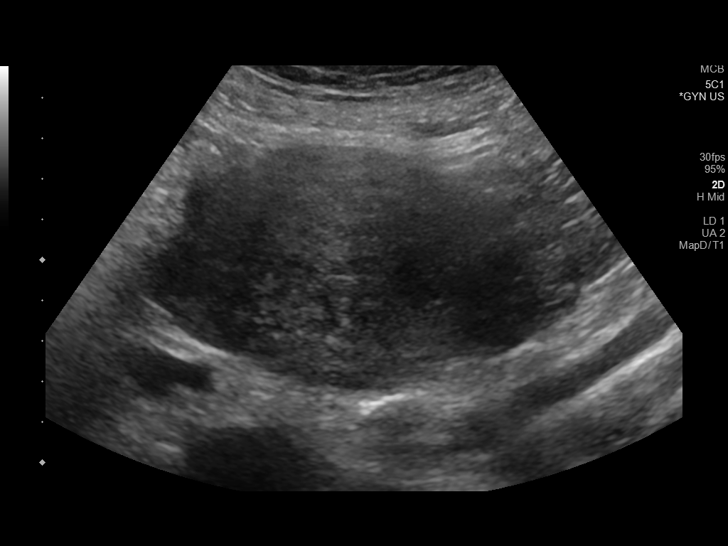
[im 21/82]
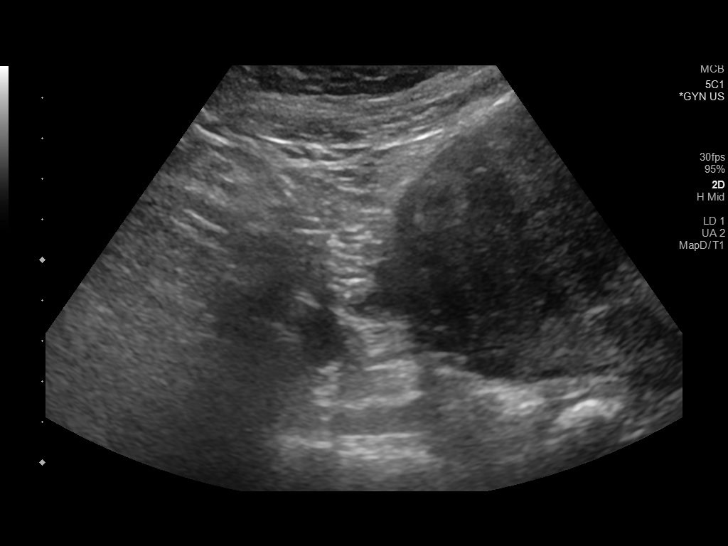
[im 28/82]
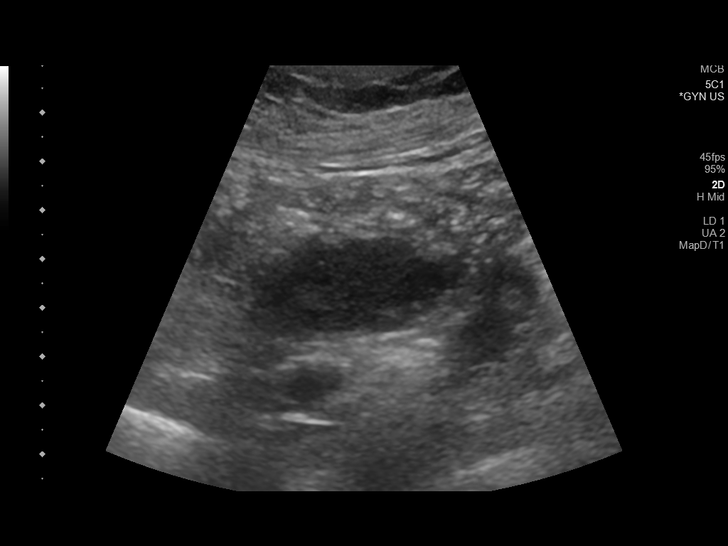
[im 34/82]
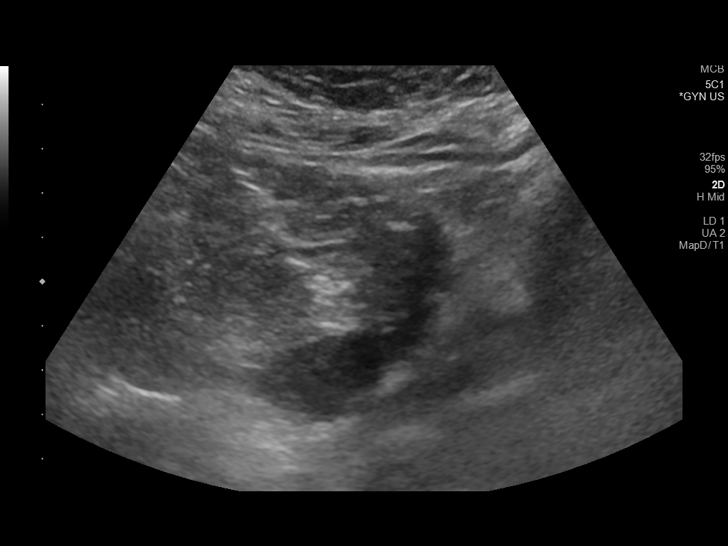
[im 41/82]
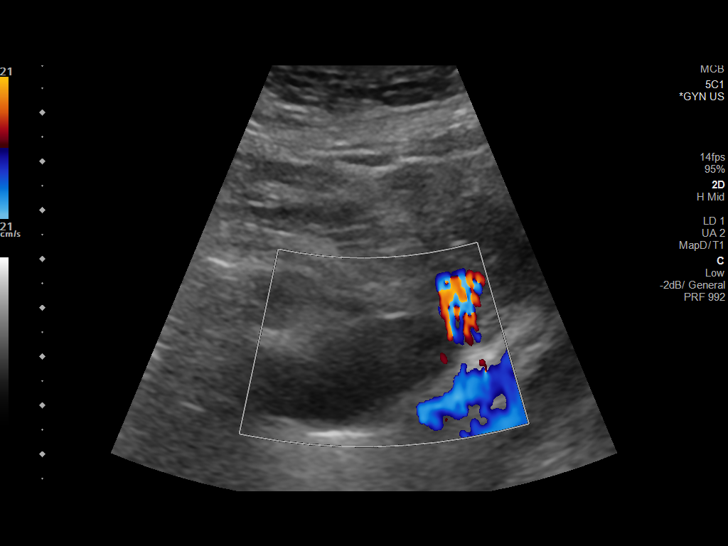
[im 48/82]
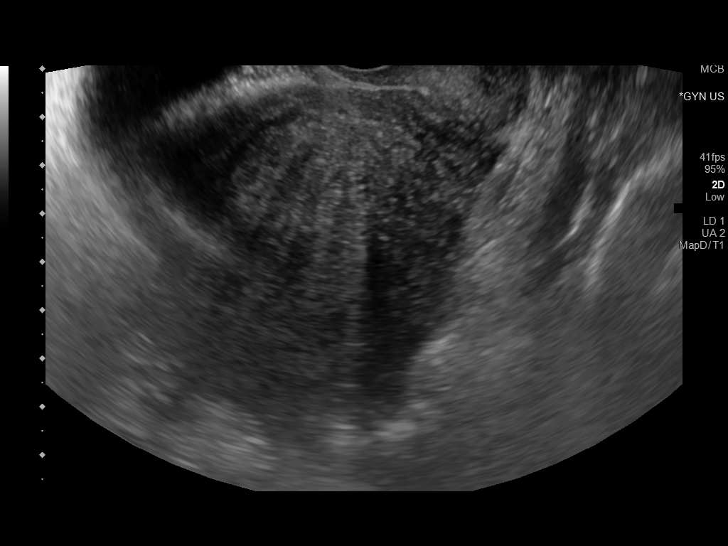
[im 55/82]
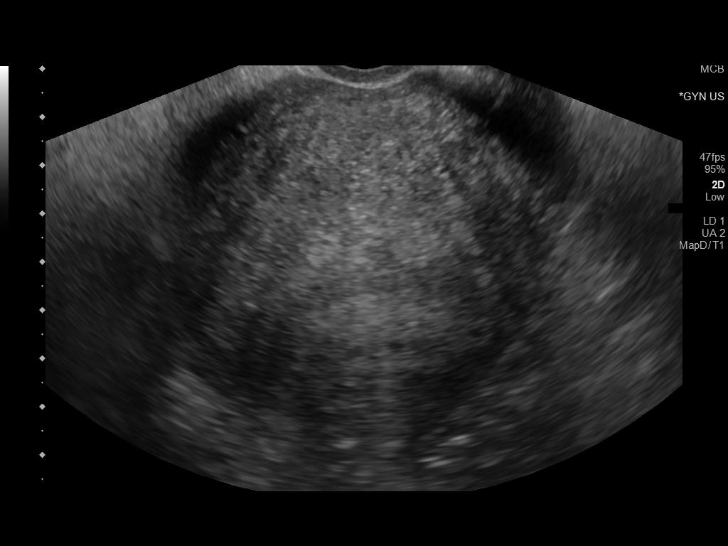
[im 61/82]
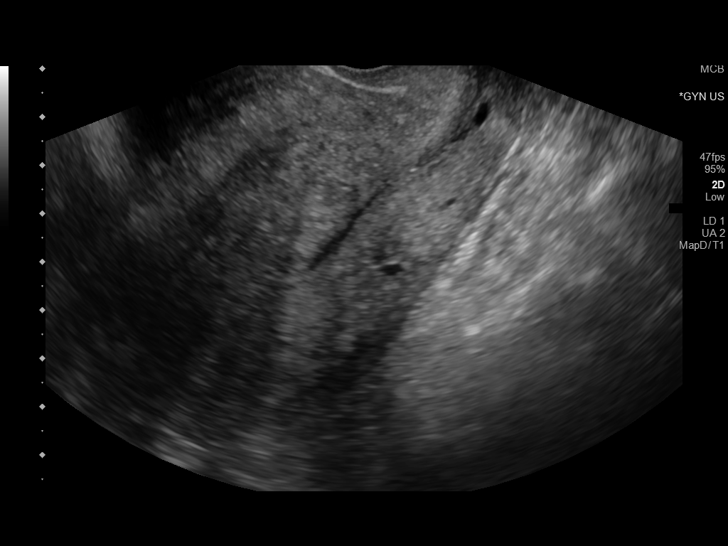
[im 68/82]
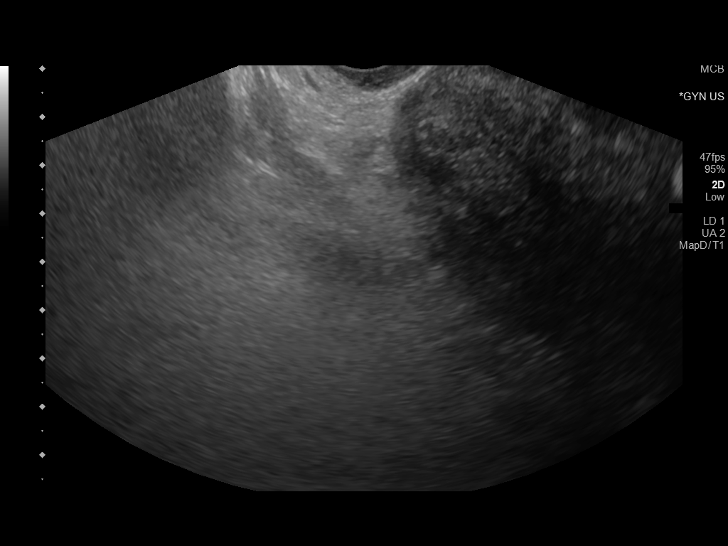
[im 75/82]
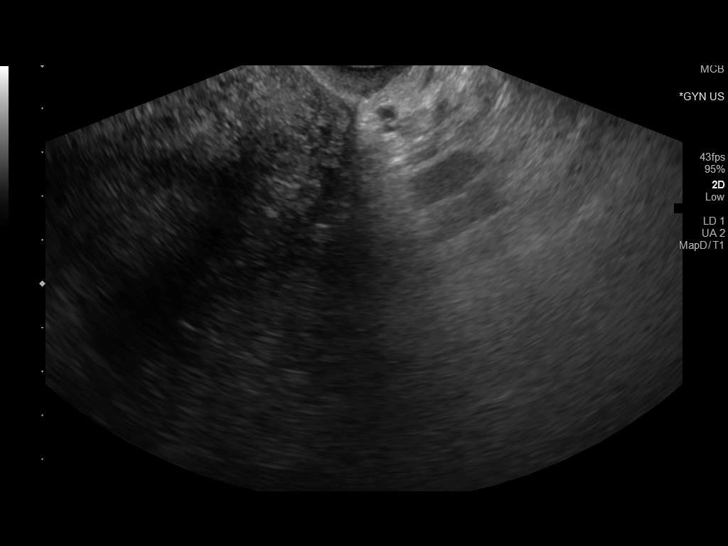
[im 82/82]
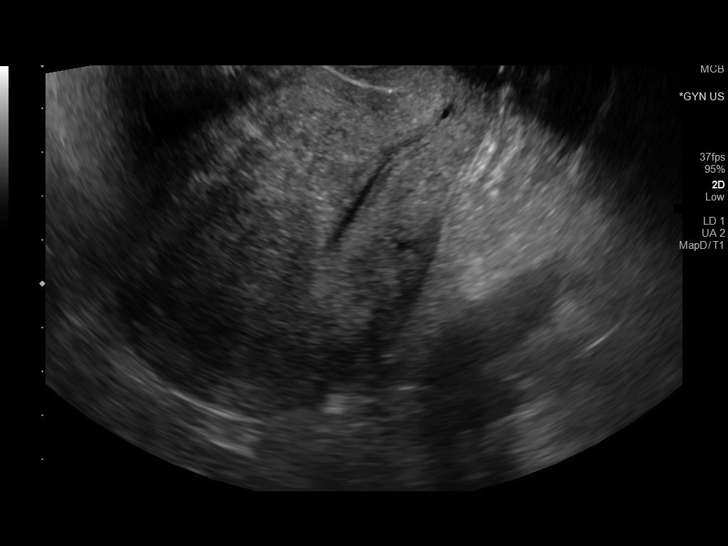

[13 of 25 positions shown; findings below may reference images not displayed]

FINDINGS: Uterus

Measurements: 10.4 x 7.4 x 8.8 cm = volume: Heterogeneous mL.
Asymmetric significant anterior wall thickening of the uterus either
representing a poorly defined leiomyoma which cannot be accurately
measured or an infiltrative process such as adenomyosis. No discrete
margins are identified.

Endometrium

Thickness: 12 mm. Small amount of endometrial fluid at the mid to
lower uterine segments. Endometrial stripe at the upper uterine
segment is not well visualized

Right ovary

Measurements: 4.3 x 2.0 x 3.6 cm = volume: 16.2 mL. Normal
morphology without mass

Left ovary

Measurements: 3.9 x 1.9 x 4.3 cm = volume: 16.5 mL. Normal
morphology without mass

Other findings

No free pelvic fluid.  No adnexal masses.
IMPRESSION: Nonspecific endometrial fluid.

Endometrial complex is 12 mm thick, normal, though the stripe is
inadequately visualized in the upper uterine segment.

Asymmetric wall thickening of the anterior uterus either
representing a poorly defined anterior wall leiomyoma which extends
submucosal or an infiltrative process such as adenomyosis.
